# Patient Record
Sex: Male | Born: 1950 | Race: Black or African American | Hispanic: No | Marital: Single | Smoking: Never smoker
Health system: Southern US, Community
[De-identification: ages and names within clinical notes are randomized; demographics above are authoritative.]

## PROBLEM LIST (undated history)

## (undated) DIAGNOSIS — N200 Calculus of kidney: Secondary | ICD-10-CM

## (undated) DIAGNOSIS — Z860100 Personal history of colon polyps, unspecified: Secondary | ICD-10-CM

## (undated) DIAGNOSIS — R972 Elevated prostate specific antigen [PSA]: Secondary | ICD-10-CM

## (undated) DIAGNOSIS — N529 Male erectile dysfunction, unspecified: Secondary | ICD-10-CM

## (undated) DIAGNOSIS — I10 Essential (primary) hypertension: Secondary | ICD-10-CM

## (undated) DIAGNOSIS — M1990 Unspecified osteoarthritis, unspecified site: Secondary | ICD-10-CM

## (undated) DIAGNOSIS — M519 Unspecified thoracic, thoracolumbar and lumbosacral intervertebral disc disorder: Secondary | ICD-10-CM

## (undated) DIAGNOSIS — C61 Malignant neoplasm of prostate: Secondary | ICD-10-CM

## (undated) DIAGNOSIS — R339 Retention of urine, unspecified: Secondary | ICD-10-CM

## (undated) DIAGNOSIS — E785 Hyperlipidemia, unspecified: Secondary | ICD-10-CM

## (undated) DIAGNOSIS — H269 Unspecified cataract: Secondary | ICD-10-CM

## (undated) DIAGNOSIS — R319 Hematuria, unspecified: Secondary | ICD-10-CM

## (undated) DIAGNOSIS — N400 Enlarged prostate without lower urinary tract symptoms: Secondary | ICD-10-CM

## (undated) HISTORY — PX: APPENDECTOMY: SHX54

## (undated) HISTORY — DX: Benign prostatic hyperplasia without lower urinary tract symptoms: N40.0

## (undated) HISTORY — DX: Personal history of colon polyps, unspecified: Z86.0100

## (undated) HISTORY — DX: Calculus of kidney: N20.0

## (undated) HISTORY — DX: Male erectile dysfunction, unspecified: N52.9

## (undated) HISTORY — PX: COLONOSCOPY: SHX174

## (undated) HISTORY — DX: Essential (primary) hypertension: I10

## (undated) HISTORY — PX: POLYPECTOMY: SHX149

## (undated) HISTORY — DX: Hyperlipidemia, unspecified: E78.5

## (undated) HISTORY — PX: PROSTATE SURGERY: SHX751

## (undated) HISTORY — PX: TONSILLECTOMY: SUR1361

## (undated) HISTORY — DX: Personal history of colonic polyps: Z86.010

## (undated) HISTORY — DX: Unspecified cataract: H26.9

---

## 1999-04-14 ENCOUNTER — Other Ambulatory Visit: Admission: RE | Admit: 1999-04-14 | Discharge: 1999-04-14 | Payer: Self-pay | Admitting: Urology

## 2003-03-22 ENCOUNTER — Ambulatory Visit (HOSPITAL_COMMUNITY): Admission: RE | Admit: 2003-03-22 | Discharge: 2003-03-22 | Payer: Self-pay | Admitting: Internal Medicine

## 2004-04-06 ENCOUNTER — Ambulatory Visit (HOSPITAL_COMMUNITY): Admission: RE | Admit: 2004-04-06 | Discharge: 2004-04-06 | Payer: Self-pay | Admitting: Urology

## 2004-08-06 HISTORY — PX: CYSTOSCOPY: SUR368

## 2005-04-12 ENCOUNTER — Ambulatory Visit (HOSPITAL_BASED_OUTPATIENT_CLINIC_OR_DEPARTMENT_OTHER): Admission: RE | Admit: 2005-04-12 | Discharge: 2005-04-12 | Payer: Self-pay | Admitting: Urology

## 2005-04-12 ENCOUNTER — Ambulatory Visit (HOSPITAL_COMMUNITY): Admission: RE | Admit: 2005-04-12 | Discharge: 2005-04-12 | Payer: Self-pay | Admitting: Urology

## 2005-08-06 HISTORY — PX: TRANSURETHRAL RESECTION OF PROSTATE: SHX73

## 2006-08-06 HISTORY — PX: LITHOTRIPSY: SUR834

## 2007-01-05 HISTORY — PX: PROSTATE BIOPSY: SHX241

## 2007-01-22 ENCOUNTER — Emergency Department (HOSPITAL_COMMUNITY): Admission: EM | Admit: 2007-01-22 | Discharge: 2007-01-23 | Payer: Self-pay | Admitting: Emergency Medicine

## 2007-01-29 ENCOUNTER — Encounter: Payer: Self-pay | Admitting: Urology

## 2007-01-29 ENCOUNTER — Inpatient Hospital Stay (HOSPITAL_COMMUNITY): Admission: RE | Admit: 2007-01-29 | Discharge: 2007-01-31 | Payer: Self-pay | Admitting: Urology

## 2008-03-01 ENCOUNTER — Ambulatory Visit: Payer: Self-pay | Admitting: Internal Medicine

## 2008-03-17 ENCOUNTER — Ambulatory Visit: Payer: Self-pay | Admitting: Internal Medicine

## 2008-03-17 ENCOUNTER — Encounter: Payer: Self-pay | Admitting: Internal Medicine

## 2008-03-24 DIAGNOSIS — Z860100 Personal history of colon polyps, unspecified: Secondary | ICD-10-CM | POA: Insufficient documentation

## 2008-03-26 ENCOUNTER — Encounter: Payer: Self-pay | Admitting: Internal Medicine

## 2008-08-06 HISTORY — PX: CYSTOSCOPY: SUR368

## 2009-01-13 ENCOUNTER — Ambulatory Visit (HOSPITAL_BASED_OUTPATIENT_CLINIC_OR_DEPARTMENT_OTHER): Admission: RE | Admit: 2009-01-13 | Discharge: 2009-01-13 | Payer: Self-pay | Admitting: Urology

## 2009-08-06 HISTORY — PX: INGUINAL HERNIA REPAIR: SHX194

## 2010-04-27 ENCOUNTER — Telehealth: Payer: Self-pay | Admitting: Internal Medicine

## 2010-05-10 ENCOUNTER — Encounter: Payer: Self-pay | Admitting: Internal Medicine

## 2010-06-09 ENCOUNTER — Ambulatory Visit (HOSPITAL_COMMUNITY): Admission: RE | Admit: 2010-06-09 | Discharge: 2010-06-09 | Payer: Self-pay | Admitting: General Surgery

## 2010-09-05 NOTE — Progress Notes (Signed)
Summary: Triage/Appt. w/Dr.Wyatt  Phone Note Call from Patient Call back at Home Phone [phone]   Caller: Patient Call For: Dr Leone Payor Reason for Call: Talk to Nurse Summary of Call: Patient wants to know if he can get reffered to the doctor Dr Leone Payor wanted him to see for his hernia. Initial call taken by: Tawni Levy,  April 27, 2010 1:29 PM  Follow-up for Phone Call        Pt. states that at his Colonoscopy in 2009 Dr.Irianna Gilday told him he would refer him to a surgeon for an inginual hernia repair. Pt. would like to be referred now.  DR.Laiken Nohr-Please advise  Follow-up by: Laureen Ochs LPN,  April 27, 2010 3:48 PM  Additional Follow-up for Phone Call Additional follow up Details #1::        [facility] - anyone that does laparoscopic hernia repair Additional Follow-up by: Iva Boop MD, Clementeen Graham,  May 01, 2010 6:56 AM    Additional Follow-up for Phone Call Additional follow up Details #2::    Pt. is scheduled to see Dr.Wyatt on 05-09-10 at 10:25am. I have left pt. a msg. with appt. information and mailed him an appt. reminder with [facility] information on it. Pt. instructed to call back as needed.  Follow-up by: Laureen Ochs LPN,  May 01, 2010 10:36 AM

## 2010-09-07 NOTE — Letter (Signed)
Summary: [facility]   Imported By: Lester [place] 07/25/2010 12:04:48  _____________________________________________________________________  External Attachment:    Type:   Image     Comment:   External Document

## 2010-10-17 LAB — BASIC METABOLIC PANEL
CO2: 26 mEq/L (ref 19–32)
Calcium: 9.7 mg/dL (ref 8.4–10.5)
Chloride: 105 mEq/L (ref 96–112)
GFR calc Af Amer: >60 mL/min (ref >60)
Glucose, Bld: 89 mg/dL (ref 70–99)
Potassium: 3.8 mEq/L (ref 3.5–5.1)
Sodium: 137 mEq/L (ref 135–145)

## 2010-10-17 LAB — DIFFERENTIAL
Basophils Absolute: 0 10*3/uL (ref 0.0–0.1)
Eosinophils Absolute: 0.1 10*3/uL (ref 0.0–0.7)
Lymphs Abs: 3.1 10*3/uL (ref 0.7–4.0)
Monocytes Absolute: 0.9 10*3/uL (ref 0.1–1.0)
Monocytes Relative: 9 % (ref 3–12)
Neutrophils Relative %: 59 % (ref 43–77)

## 2010-10-17 LAB — CBC
HCT: 46.1 % (ref 39.0–52.0)
Hemoglobin: 15.3 g/dL (ref 13.0–17.0)
MCH: 29.4 pg (ref 26.0–34.0)
MCHC: 33.2 g/dL (ref 30.0–36.0)
RBC: 5.21 MIL/uL (ref 4.22–5.81)

## 2010-11-13 LAB — POCT I-STAT 4, (NA,K, GLUC, HGB,HCT)
HCT: 47 % (ref 39.0–52.0)
Sodium: 141 mEq/L (ref 135–145)

## 2010-12-19 NOTE — Op Note (Signed)
[name], Thomas Stuart               ACCOUNT NO.:  [id number]   MEDICAL RECORD NO.:  [id number]          PATIENT TYPE:  AMB   LOCATION:  [facility]                         FACILITY:  [facility]   PHYSICIAN:  Excell Seltzer. Annabell Howells, M.D.    DATE OF BIRTH:  [birth year]   DATE OF PROCEDURE:  01/13/2009  DATE OF DISCHARGE:                               OPERATIVE REPORT   PROCEDURE:  Cystoscopy with fulguration.   PREOPERATIVE DIAGNOSIS:  Intermittent gross hematuria.   POSTOPERATIVE DIAGNOSES:  Intermittent gross hematuria with benign  prostatic hypertrophy regrowth and inflammatory urethral polyps.   SURGEON:  Dr. Bjorn Pippin.   ANESTHESIA:  General.   SPECIMEN:  None.   DRAINS:  None.   COMPLICATIONS:  None.   INDICATIONS:  [name] is a 60 year old black male with a history of BPH  with bladder outlet obstruction.  He underwent a TURP in 2008.  He has  recently had some intermittent gross hematuria.  Upper tract studies  were unremarkable with exception of a mass at the base the bladder that  was felt to probably be prostatic tissue.   FINDINGS AND PROCEDURE:  The patient was taken to the operating room  where general anesthetic was induced.  He was placed in the lithotomy  position.  His perineum and genitalia were prepped with Betadine  solution.  He was draped in the usual sterile fashion.  Cystoscopy was  performed using a 16-French flexible scope.  Examination revealed a  normal urethra.  The external sphincter was intact.  The prostatic  urethra exhibited a patent TUR defect, however, at the bladder neck on  the left there was intravesical extension of prostatic regrowth.  There  was some neovascularity in the prostatic urethra and small inflammatory  appearing polyps.  Inspection of the bladder revealed normal mucosa  without lesions.  No stones were noted.  The ureteral orifices were  unremarkable.   After thorough inspection, a Bugbee electrode was used to fulgurate the  neovascularity in  the prostatic urethra and the bladder neck and the  prostate urethral polyps as they were felt to be the most likely source  of his bleeding.  If  this not take care of the proper, he may need re-resection of prostatic  regrowth.  At this point, the bladder was drained with a 16-French red  rubber catheter which was then removed.  His anesthetic was reversed.  He was taken to the recovery room in stable condition.  There were no  complications.      Excell Seltzer. Annabell Howells, M.D.  Electronically Signed     JJW/MEDQ  D:  01/13/2009  T:  01/13/2009  Job:  [id number]

## 2010-12-19 NOTE — Op Note (Signed)
Thomas Stuart, Thomas Stuart               ACCOUNT NO.:  [id number]   MEDICAL RECORD NO.:  [id number]          PATIENT TYPE:  INP   LOCATION:  1410                         FACILITY:  [facility]   PHYSICIAN:  Excell Seltzer. Annabell Howells, M.D.    DATE OF BIRTH:  January 16, 1951   DATE OF PROCEDURE:  01/30/2007  DATE OF DISCHARGE:                               OPERATIVE REPORT   PROCEDURE:  Transurethral resection of prostate.   PREOPERATIVE DIAGNOSIS:  Benign prostatic hypertrophy with bladder  outlet obstruction and hematuria.   POSTOPERATIVE DIAGNOSIS:  Benign prostatic hypertrophy with bladder  outlet obstruction and hematuria.   SURGEON:  Dr. Bjorn Pippin.   ANESTHESIA:  General.   SPECIMEN:  Prostate chips.   DRAIN:  24-French three-way Foley catheter.   COMPLICATIONS:  None.   INDICATIONS:  Thomas Stuart is a 60 year old black male with enlarged  prostate.  He had been on Avodart because of recurrent hematuria, but  despite this treatment, he developed gross hematuria with clot retention  was felt that TURP was indicated.   FINDINGS AND PROCEDURE:  The patient is given Cipro, was taken operating  room where general anesthetic was induced and he was placed in lithotomy  position.  His Foley catheter was removed.  He was prepped with Betadine  solution.  He was draped in the usual sterile fashion.  A 28-French  continuous flow resectoscope sheath was inserted.  This was fitted with  12 degrees lens with Wandra Scot handle and TUR loop.  Examination revealed  a long prostate with significant bilobar hyperplasia but also an annular  middle lobe protruding into the bladder in a donut-like fashion.  I was  unable to identify the ureteral orifices due to the extent of the middle  lobe enlargement.  But I did see an area of what appeared to be recent  bleeding on the right middle lobe area.  The posterior wall of the  bladder had significant catheter irritation but no tumors or stones were  seen.   Resection of  the prostate was performed initially the middle lobe was  resected.  The patient's prostate was quite vascular and the resection  was quite tedious due to the bulk of the middle lobe and the vascularity  but I was eventually able to debulk the middle lobe sufficiently with  great care taken to try to avoid digging into the bladder wall as I  resected the middle lobe back to the bladder neck.  I then resected the  bladder neck area from five to seven o'clock down to the capsular fibers  and then resected the floor of the prostate out to alongside the  verumontanum.  The chips were evacuated from this portion of the  resection and I then resected the left lobe from bladder neck to apex  out to the capsular fibers.  This was followed by the right lobe.  Significant residual apical and middle lobe tissue was then resected  after the initial resection had been completed. At this point the  bladder was evacuated free of chips and hemostasis was achieved.  I  completed one  hour of resection and had debulked the prostate  significantly; however, I did not perform a complete resection due to  the bulk of the prostate and there is a possibility that he may require  a second staged TURP.  After completion of procedure, inspection  revealed no evidence of injury to the external sphincter.  No retained  chips or active bleeding, the ureteral orifices were never visualized  during the procedure.  After removal of the scope pressure on the  bladder produced an excellent stream.  A 24-French three-way Foley  catheter was then inserted with aid of a catheter guide.  The balloon  was filled initially with 30 mL of fluid but finally I ended up using 50  mL of fluid to provide adequate compression of the bladder neck during  irrigation.  With tension on Foley and irrigation, the efflux eventually  cleared.  The patient was placed on continuous irrigation.  The Foley  catheter was secured to the patient's  thigh with Velcro strap on mild  traction.  The patient is taken down from lithotomy position.  His  anesthetic was reversed.  He was removed to the recovery room in stable  condition.  There were no complications.      Excell Seltzer. Annabell Howells, M.D.  Electronically Signed     JJW/MEDQ  D:  01/30/2007  T:  01/31/2007  Job:  [id number]

## 2010-12-22 NOTE — Op Note (Signed)
NAMEERVINE, WITUCKI               ACCOUNT NO.:  [id number]   MEDICAL RECORD NO.:  [id number]          PATIENT TYPE:  AMB   LOCATION:  [facility]                         FACILITY:  [facility]   PHYSICIAN:  Excell Seltzer. Annabell Howells, M.D.    DATE OF BIRTH:  1950/10/07   DATE OF PROCEDURE:  04/12/2005  DATE OF DISCHARGE:                                 OPERATIVE REPORT   PROCEDURE:  Cystoscopy, removal of stone from the prostatic urethra.   PREOPERATIVE DIAGNOSES:  1.  Benign prostatic hypertrophy with bladder outlet obstruction.  2.  Prostatic urethral stone.  3.  Hematuria.   POSTOPERATIVE DIAGNOSES:  1.  Benign prostatic hypertrophy with bladder outlet obstruction.  2.  Prostatic urethral stone.  3.  Hematuria.   SURGEON:  Excell Seltzer. Annabell Howells, M.D.   ANESTHESIA:  General.   SPECIMEN:  Stone.   COMPLICATIONS:  None.   INDICATIONS:  Mr. Rodarte is a 60 year old black male with a history of  urolithiasis who has developed intermittent gross hematuria. Cystoscopy in  the office revealed two stones impacted in the prostatic urethra. He has  since passed one but continues to have bleeding. It was felt that cysto and  stone removal was indicated. A CT scan revealed no stones in the bladder.  There was a small stone in one kidney and there was some haziness at the  base the bladder and I felt he needed to be evaluated for possible bladder  tumor as well.   FINDINGS AND PROCEDURE:  The patient was taken to the operating room after  receiving p.o. Cipro. A general anesthetic was induced. He was placed in  lithotomy position. His perineum and genitalia were prepped with Betadine  solution and he was draped in the usual sterile fashion. Cystoscopy was  performed using a 16-French flexible scope. Examination revealed a normal  urethra. The external sphincter was intact. The prostatic urethra was  approximately 3-4 cm in length with bilobar hyperplasia with coaptation and  obstruction. Anterior near the bladder  neck was a stone impacted in the  prostatic urethra. Examination of the bladder revealed mild trabeculation.  No tumors or stones were noted. There was intravesical extension of the  prostate but without a true middle lobe. Ureteral orifices were not well  seen.   The stone was then grasped with a nitinol stone basket and removed without  difficulty.   Reinspection of the bladder revealed no additional abnormalities or active  bleeding.   The patient was taken down from lithotomy position. His anesthetic was  reversed. He was moved to the recovery room in stable condition and there  were no complications.      Excell Seltzer. Annabell Howells, M.D.  Electronically Signed     JJW/MEDQ  D:  04/12/2005  T:  04/12/2005  Job:  [id number]   cc:   Gloriajean Dell. Andrey Campanile, M.D.  [address]  [place]  [place] [address]  Fax: [phone]

## 2011-04-25 ENCOUNTER — Encounter: Payer: Self-pay | Admitting: Internal Medicine

## 2011-04-30 ENCOUNTER — Encounter: Payer: Self-pay | Admitting: Internal Medicine

## 2011-05-23 LAB — BASIC METABOLIC PANEL
CO2: 29
Calcium: 9.7
GFR calc Af Amer: >60
GFR calc non Af Amer: >60
Sodium: 145

## 2011-05-23 LAB — URINALYSIS, ROUTINE W REFLEX MICROSCOPIC
Specific Gravity, Urine: 1.029
Urobilinogen, UA: 1

## 2011-05-23 LAB — BASIC METABOLIC PANEL WITH GFR
BUN: 8
CO2: 28
Calcium: 8.6
Chloride: 103
Creatinine, Ser: 0.78
GFR calc non Af Amer: >60
Glucose, Bld: 161 — ABNORMAL HIGH
Potassium: 4.1
Sodium: 137

## 2011-05-23 LAB — HEMOGLOBIN AND HEMATOCRIT, BLOOD
HCT: 37.3 — ABNORMAL LOW
Hemoglobin: 12.7 — ABNORMAL LOW

## 2011-05-23 LAB — URINE MICROSCOPIC-ADD ON

## 2011-05-23 LAB — CBC
Platelets: 339
RBC: 3.44 — ABNORMAL LOW
WBC: 15.7 — ABNORMAL HIGH

## 2011-06-01 ENCOUNTER — Ambulatory Visit (AMBULATORY_SURGERY_CENTER): Payer: Managed Care, Other (non HMO) | Admitting: *Deleted

## 2011-06-01 ENCOUNTER — Encounter: Payer: Self-pay | Admitting: Internal Medicine

## 2011-06-01 VITALS — Ht 71.0 in | Wt 215.0 lb

## 2011-06-01 DIAGNOSIS — Z1211 Encounter for screening for malignant neoplasm of colon: Secondary | ICD-10-CM

## 2011-06-01 MED ORDER — PEG-KCL-NACL-NASULF-NA ASC-C 100 G PO SOLR: ORAL | Status: DC

## 2011-06-15 ENCOUNTER — Encounter: Payer: Self-pay | Admitting: Internal Medicine

## 2011-06-15 ENCOUNTER — Ambulatory Visit (AMBULATORY_SURGERY_CENTER): Payer: Managed Care, Other (non HMO) | Admitting: Internal Medicine

## 2011-06-15 VITALS — BP 128/85 | HR 68 | Temp 97.0°F | Resp 16 | Ht 71.0 in | Wt 215.0 lb

## 2011-06-15 DIAGNOSIS — Z860100 Personal history of colon polyps, unspecified: Secondary | ICD-10-CM

## 2011-06-15 DIAGNOSIS — Z1211 Encounter for screening for malignant neoplasm of colon: Secondary | ICD-10-CM

## 2011-06-15 MED ORDER — SODIUM CHLORIDE 0.9 % IV SOLN: 500.0000 mL | INTRAVENOUS | Status: DC

## 2011-06-15 NOTE — Patient Instructions (Addendum)
No polyps today! Next routine preventive colonoscopy in about 5 years (2017).  PLEASE FOLLOW DISCHARGE INSTRUCTIONS GIVEN TODAY. SEE HANDOUTS. RESUME CURRENT MEDICATIONS. CALL us WITH ANY QUESTIONS OR CONCERNS [phone]. THANK YOU!

## 2011-06-18 ENCOUNTER — Telehealth: Payer: Self-pay | Admitting: *Deleted

## 2011-06-18 NOTE — Telephone Encounter (Signed)

## 2013-06-16 ENCOUNTER — Other Ambulatory Visit: Payer: Self-pay | Admitting: Urology

## 2013-06-16 DIAGNOSIS — R972 Elevated prostate specific antigen [PSA]: Secondary | ICD-10-CM

## 2013-06-24 ENCOUNTER — Ambulatory Visit (HOSPITAL_COMMUNITY)
Admission: RE | Admit: 2013-06-24 | Discharge: 2013-06-24 | Disposition: A | Discharge status: Home / Self Care | Payer: BC Managed Care – PPO | Source: Ambulatory Visit | Attending: Urology | Admitting: Urology

## 2013-06-24 DIAGNOSIS — R972 Elevated prostate specific antigen [PSA]: Secondary | ICD-10-CM

## 2013-06-24 DIAGNOSIS — N4289 Other specified disorders of prostate: Secondary | ICD-10-CM | POA: Insufficient documentation

## 2013-06-24 MED ORDER — GADOBENATE DIMEGLUMINE 529 MG/ML IV SOLN
20.0000 mL | Freq: Once | INTRAVENOUS | Status: AC | PRN
  Administered 2013-06-24: 20 mL via INTRAVENOUS

## 2013-06-25 LAB — POCT I-STAT, CHEM 8
Calcium, Ion: 1.18 mmol/L (ref 1.13–1.30)
Creatinine, Ser: 1 mg/dL (ref 0.50–1.35)
Glucose, Bld: 102 mg/dL — ABNORMAL HIGH (ref 70–99)
HCT: 46 % (ref 39.0–52.0)
Hemoglobin: 15.6 g/dL (ref 13.0–17.0)
Potassium: 3.2 mEq/L — ABNORMAL LOW (ref 3.5–5.1)

## 2014-04-07 ENCOUNTER — Other Ambulatory Visit: Payer: Self-pay | Admitting: Urology

## 2014-04-08 ENCOUNTER — Encounter (HOSPITAL_BASED_OUTPATIENT_CLINIC_OR_DEPARTMENT_OTHER): Payer: Self-pay | Admitting: *Deleted

## 2014-04-08 NOTE — Progress Notes (Signed)
Pt instructed npo p mn 9/9.  To [facility] 9/10 @ 0600.  Needs ekg, istat-8 on arrival. Pt aware to do fleets enema 2 hrs prior to arrival

## 2014-04-14 NOTE — H&P (Signed)
Active Problems Problems  1. Benign prostatic hyperplasia with urinary obstruction (600.01,599.69) 2. Elevated prostate specific antigen (PSA) (790.93) 3. 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 History of Gross hematuria (599.71)  History of Present Illness Thomas Stuart returns today in f/u.  He has had some recent hematuria with drips at the end of his stream.  He had it for about 2 days but it has been clear for 5 days.  He was switched to finasteride from Avodart for his BPH regrowth with hematuria. His PSA is up further to 15.56 from 13.43 in June and 9.91 in 7/14.  His PSA had been up to over 11 and has had at least 2 negative biopsies with the last in 09/2003 when his PSA was 11.5 and prior to starting Avodart in 2006. It has been as low as 6 on therapy. He is voiding well without complaints and has voided well since. He had a TURP in 2008. He had an MRI in 10/14 that showed no areas of concern. He does have a large intravesical prostate lobe.     Past Medical History Problems  1. History of Disc degeneration, lumbar (722.52) 2. History of Gross hematuria (599.71) 3. History of hypertension (V12.59) 4. History of kidney stones (V13.01) 5. History of Urinary retention (788.20)  Surgical History Problems  1. History of Biopsy Of The Prostate Needle 2. History of Cystoscopy With Fulguration 3. History of Hernia Repair 4. History of Hernia Repair 5. History of Lithotripsy 6. History of Transurethral Resection Of Prostate (TURP)  Current Meds 1. Benazepril-Hydrochlorothiazide 20-12.5 MG Oral Tablet;  Therapy: (Recorded:10Nov2014) to Recorded 2. Cialis 20 MG Oral Tablet; TAKE 1 TABLET As Directed PRN;  Therapy: 24Oct2008 to (Last Rx:22Apr2013)  Requested for: 22Apr2013 Ordered 3. Finasteride 5 MG Oral Tablet; TAKE 1 TABLET DAILY AS DIRECTED;  Therapy: 30Apr2014 to (Evaluate:06May2016)  Requested for: [id number]; Last  Rx:12May2015 Ordered 4. Meloxicam 7.5 MG Oral  Tablet;  Therapy: 30Apr2014 to Recorded 5. Multi-Vitamin TABS;  Therapy: (Recorded:25Jun2010) to Recorded 6. Vitamin B-12 TABS;  Therapy: (Recorded:25Jun2010) to Recorded 7. Vitamin C TABS;  Therapy: (Recorded:25Jun2010) to Recorded 8. Vitamin D TABS;  Therapy: (Recorded:22Apr2013) to Recorded 9. Vitamin E TABS;  Therapy: (Recorded:30Apr2014) to Recorded  Allergies Medication  1. No Known Drug Allergies  Family History Problems  1. Family history of malignant neoplasm of male breast (V16.3) : Mother 2. Family history of senile dementia (V17.2) : Mother 3. Denied: Family history of Prostate Cancer  Social History Problems  1. Denied: Alcohol Use 2. Denied: History of Caffeine Use 3. Marital History - Single 4. 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 Denied: Tobacco Use  Review of Systems  Genitourinary: hematuria.  Constitutional: no fever.  Cardiovascular: no chest pain.  Respiratory: no shortness of breath.    Vitals Vital Signs [Data Includes: Last 1 Day]  Recorded: 26Aug2015 10:24AM  Blood Pressure: 162 / 90 Temperature: 98 F Heart Rate: 73  Results/Data Urine [Data Includes: Last 1 Day]   26Aug2015  COLOR STRAW   APPEARANCE CLEAR   SPECIFIC GRAVITY <1.005   pH 6.5   GLUCOSE NEG mg/dL  BILIRUBIN NEG   KETONE NEG mg/dL  BLOOD NEG   PROTEIN NEG mg/dL  UROBILINOGEN 0.2 mg/dL  NITRITE NEG   LEUKOCYTE ESTERASE NEG    The following clinical lab reports were reviewed:  UA and PSA reviewed. PSA Flowsheet Thomas Stuart Includes: Last 3 Years]   21Aug2015 [id number] Thomas Stuart Thomas Stuart  PSA  15.56 ng/mL 13.43 ng/mL 9.91 ng/mL 7.78 ng/mL  Free PSA   0.83 ng/mL 0.75 ng/mL  % Free PSA   8 % 10 %  Assessment Assessed  1. Elevated prostate specific antigen (PSA) (790.93) 2. Benign prostatic hyperplasia with urinary obstruction (600.01,599.69) 3. History of Gross hematuria (599.71)  His PSA continues to rise on the finasteride and he  has had additional hematuria.   Plan Elevated prostate specific antigen (PSA)  1. Start: Diazepam 10 MG Oral Tablet; Take 1 -2 po one hour prior to procedure 2. Start: Levofloxacin 500 MG Oral Tablet (Levaquin); Take one tablet daily starting day  before procedure 3. PROSTATE BIOPSY ULTRASOUND; Status:Hold For - Appointment,Date of Service;  Requested for:26Aug2015;  Health Maintenance  4. UA With REFLEX; [Do Not Release]; Status:Resulted - Requires Verification;   Done:  Thomas Stuart Thomas Stuart PMH: Gross hematuria  5. AU CT-HEMATURIA PROTOCOL; Status:Hold For - Appointment,Date of  Service,PreCert,Print; Requested for:26Aug2015;  6. Cysto; Status:Hold For - Appointment,Date of Service; Requested for:26Aug2015;  7. Follow-up Office  Follow-up  Status: Hold For - Appointment,Date of Service  Requested  for: 26Aug2015  I am going to get a CT hematuria study and then get him set up for a prostate [place] and biopsy and cystoscopy. The risks of bleeding, infection and voiding difficulty were reviewed. I will give him valium and levaquin for the procedure.   Discussion/Summary CC: Dr. Kathryne Eriksson.

## 2014-04-15 ENCOUNTER — Ambulatory Visit (HOSPITAL_BASED_OUTPATIENT_CLINIC_OR_DEPARTMENT_OTHER): Payer: BC Managed Care – PPO | Admitting: Anesthesiology

## 2014-04-15 ENCOUNTER — Encounter (HOSPITAL_BASED_OUTPATIENT_CLINIC_OR_DEPARTMENT_OTHER): Payer: Self-pay

## 2014-04-15 ENCOUNTER — Encounter (HOSPITAL_BASED_OUTPATIENT_CLINIC_OR_DEPARTMENT_OTHER): Payer: BC Managed Care – PPO | Admitting: Anesthesiology

## 2014-04-15 ENCOUNTER — Encounter (HOSPITAL_BASED_OUTPATIENT_CLINIC_OR_DEPARTMENT_OTHER)
Admission: RE | Disposition: A | Discharge status: Home / Self Care | Payer: Self-pay | Source: Ambulatory Visit | Attending: Urology

## 2014-04-15 ENCOUNTER — Ambulatory Visit (HOSPITAL_BASED_OUTPATIENT_CLINIC_OR_DEPARTMENT_OTHER)
Admission: RE | Admit: 2014-04-15 | Discharge: 2014-04-15 | Disposition: A | Discharge status: Home / Self Care | Payer: BC Managed Care – PPO | Source: Ambulatory Visit | Attending: Urology | Admitting: Urology

## 2014-04-15 DIAGNOSIS — C61 Malignant neoplasm of prostate: Secondary | ICD-10-CM

## 2014-04-15 DIAGNOSIS — N401 Enlarged prostate with lower urinary tract symptoms: Secondary | ICD-10-CM | POA: Diagnosis present

## 2014-04-15 DIAGNOSIS — Z87442 Personal history of urinary calculi: Secondary | ICD-10-CM | POA: Insufficient documentation

## 2014-04-15 DIAGNOSIS — R972 Elevated prostate specific antigen [PSA]: Secondary | ICD-10-CM | POA: Diagnosis present

## 2014-04-15 DIAGNOSIS — I10 Essential (primary) hypertension: Secondary | ICD-10-CM | POA: Insufficient documentation

## 2014-04-15 DIAGNOSIS — R310 Gross hematuria: Secondary | ICD-10-CM | POA: Diagnosis present

## 2014-04-15 DIAGNOSIS — N138 Other obstructive and reflux uropathy: Secondary | ICD-10-CM | POA: Diagnosis present

## 2014-04-15 HISTORY — DX: Unspecified osteoarthritis, unspecified site: M19.90

## 2014-04-15 HISTORY — DX: Elevated prostate specific antigen (PSA): R97.20

## 2014-04-15 HISTORY — PX: PROSTATE BIOPSY: SHX241

## 2014-04-15 HISTORY — DX: Malignant neoplasm of prostate: C61

## 2014-04-15 HISTORY — DX: Hematuria, unspecified: R31.9

## 2014-04-15 HISTORY — PX: CYSTOSCOPY: SHX5120

## 2014-04-15 LAB — POCT I-STAT, CHEM 8
BUN: 7 mg/dL (ref 6–23)
Calcium, Ion: 1.11 mmol/L — ABNORMAL LOW (ref 1.13–1.30)
Chloride: 103 mEq/L (ref 96–112)
Creatinine, Ser: 0.9 mg/dL (ref 0.50–1.35)
Glucose, Bld: 121 mg/dL — ABNORMAL HIGH (ref 70–99)
HCT: 47 % (ref 39.0–52.0)
Hemoglobin: 16 g/dL (ref 13.0–17.0)
Potassium: 3.6 mEq/L — ABNORMAL LOW (ref 3.7–5.3)
Sodium: 139 mEq/L (ref 137–147)
TCO2: 25 mmol/L (ref 0–100)

## 2014-04-15 SURGERY — CYSTOSCOPY
Anesthesia: General | Site: Prostate

## 2014-04-15 MED ORDER — LIDOCAINE HCL 2 % EX GEL
CUTANEOUS | Status: DC | PRN
  Administered 2014-04-15: 1 via URETHRAL

## 2014-04-15 MED ORDER — LACTATED RINGERS IV SOLN
INTRAVENOUS | Status: DC
  Administered 2014-04-15 (×2): via INTRAVENOUS
  Filled 2014-04-15: qty 1000

## 2014-04-15 MED ORDER — ONDANSETRON HCL 4 MG/2ML IJ SOLN
INTRAMUSCULAR | Status: DC | PRN
  Administered 2014-04-15: 4 mg via INTRAVENOUS

## 2014-04-15 MED ORDER — DEXAMETHASONE SODIUM PHOSPHATE 4 MG/ML IJ SOLN
INTRAMUSCULAR | Status: DC | PRN
  Administered 2014-04-15: 8 mg via INTRAVENOUS

## 2014-04-15 MED ORDER — CEFTRIAXONE SODIUM 2 G IJ SOLR
2.0000 g | INTRAMUSCULAR | Status: AC
  Administered 2014-04-15: 2 g via INTRAVENOUS
  Filled 2014-04-15: qty 2

## 2014-04-15 MED ORDER — CEFTRIAXONE SODIUM 2 G IJ SOLR
INTRAMUSCULAR | Status: AC
  Filled 2014-04-15: qty 2

## 2014-04-15 MED ORDER — PROPOFOL INFUSION 10 MG/ML OPTIME
INTRAVENOUS | Status: DC | PRN
  Administered 2014-04-15: 150 ug/kg/min via INTRAVENOUS

## 2014-04-15 MED ORDER — MIDAZOLAM HCL 2 MG/2ML IJ SOLN
INTRAMUSCULAR | Status: AC
  Filled 2014-04-15: qty 2

## 2014-04-15 MED ORDER — LIDOCAINE HCL (CARDIAC) 20 MG/ML IV SOLN
INTRAVENOUS | Status: DC | PRN
  Administered 2014-04-15: 60 mg via INTRAVENOUS

## 2014-04-15 MED ORDER — FENTANYL CITRATE 0.05 MG/ML IJ SOLN
INTRAMUSCULAR | Status: AC
  Filled 2014-04-15: qty 6

## 2014-04-15 MED ORDER — SODIUM CHLORIDE 0.9 % IR SOLN
Status: DC | PRN
  Administered 2014-04-15: 3000 mL

## 2014-04-15 MED ORDER — GENTAMICIN SULFATE 40 MG/ML IJ SOLN
5.0000 mg/kg | Freq: Once | INTRAVENOUS | Status: AC
  Administered 2014-04-15: 430 mg via INTRAVENOUS
  Filled 2014-04-15: qty 10.75

## 2014-04-15 MED ORDER — FLEET ENEMA 7-19 GM/118ML RE ENEM
1.0000 | ENEMA | Freq: Once | RECTAL | Status: DC
  Filled 2014-04-15: qty 1

## 2014-04-15 MED ORDER — HYDROCODONE-ACETAMINOPHEN 5-325 MG PO TABS: 1.0000 | ORAL_TABLET | Freq: Four times a day (QID) | ORAL | Status: DC | PRN

## 2014-04-15 MED ORDER — MIDAZOLAM HCL 5 MG/5ML IJ SOLN
INTRAMUSCULAR | Status: DC | PRN
  Administered 2014-04-15: 2 mg via INTRAVENOUS

## 2014-04-15 MED ORDER — GENTAMICIN SULFATE 40 MG/ML IJ SOLN: 5.0000 mg/kg | INTRAVENOUS | Status: DC

## 2014-04-15 MED ORDER — PROPOFOL 10 MG/ML IV BOLUS
INTRAVENOUS | Status: DC | PRN
  Administered 2014-04-15: 180 mg via INTRAVENOUS

## 2014-04-15 MED ORDER — FENTANYL CITRATE 0.05 MG/ML IJ SOLN
INTRAMUSCULAR | Status: DC | PRN
  Administered 2014-04-15: 25 ug via INTRAVENOUS
  Administered 2014-04-15: 50 ug via INTRAVENOUS
  Administered 2014-04-15: 25 ug via INTRAVENOUS

## 2014-04-15 SURGICAL SUPPLY — 34 items
BAG DRAIN URO-CYSTO SKYTR STRL (DRAIN) ×2 IMPLANT
CANISTER SUCT LVC 12 LTR MEDI- (MISCELLANEOUS) ×2 IMPLANT
CATH FOLEY 2WAY SLVR  5CC 16FR (CATHETERS)
CATH FOLEY 2WAY SLVR 5CC 16FR (CATHETERS) IMPLANT
CATH URET 5FR 28IN OPEN ENDED (CATHETERS) IMPLANT
CLOTH BEACON ORANGE TIMEOUT ST (SAFETY) ×2 IMPLANT
DRAPE CAMERA CLOSED 9X96 (DRAPES) ×4 IMPLANT
DRESSING TELFA 8X3 (GAUZE/BANDAGES/DRESSINGS) ×2 IMPLANT
ELECT LOOP MED HF 24F 12D CBL (CLIP) ×2 IMPLANT
ELECT REM PT RETURN 9FT ADLT (ELECTROSURGICAL)
ELECTRODE REM PT RTRN 9FT ADLT (ELECTROSURGICAL) IMPLANT
GLOVE BIOGEL M 6.5 STRL (GLOVE) ×2 IMPLANT
GLOVE BIOGEL PI IND STRL 6.5 (GLOVE) ×2 IMPLANT
GLOVE BIOGEL PI INDICATOR 6.5 (GLOVE) ×2
GLOVE SURG SS PI 8.0 STRL IVOR (GLOVE) ×2 IMPLANT
GOWN PREVENTION PLUS LG XLONG (DISPOSABLE) IMPLANT
GOWN STRL REIN XL XLG (GOWN DISPOSABLE) IMPLANT
GOWN STRL REUS W/TWL LRG LVL3 (GOWN DISPOSABLE) ×2 IMPLANT
GOWN STRL REUS W/TWL XL LVL3 (GOWN DISPOSABLE) ×2 IMPLANT
GUIDEWIRE STR DUAL SENSOR (WIRE) IMPLANT
IV NS IRRIG 3000ML ARTHROMATIC (IV SOLUTION) ×2 IMPLANT
NDL SAFETY ECLIPSE 18X1.5 (NEEDLE) IMPLANT
NEEDLE HYPO 18GX1.5 SHARP (NEEDLE)
NEEDLE HYPO 22GX1.5 SAFETY (NEEDLE) IMPLANT
NS IRRIG 500ML POUR BTL (IV SOLUTION) IMPLANT
PACK CYSTOSCOPY (CUSTOM PROCEDURE TRAY) ×2 IMPLANT
SURGILUBE 2OZ TUBE FLIPTOP (MISCELLANEOUS) ×2 IMPLANT
SYR 20CC LL (SYRINGE) IMPLANT
SYR BULB IRRIGATION 50ML (SYRINGE) ×2 IMPLANT
SYRINGE IRR TOOMEY STRL 70CC (SYRINGE) ×2 IMPLANT
TOWEL OR 17X24 6PK STRL BLUE (TOWEL DISPOSABLE) IMPLANT
UNDERPAD 30X30 INCONTINENT (UNDERPADS AND DIAPERS) ×2 IMPLANT
WATER STERILE IRR 3000ML UROMA (IV SOLUTION) IMPLANT
WATER STERILE IRR 500ML POUR (IV SOLUTION) ×2 IMPLANT

## 2014-04-15 NOTE — Anesthesia Preprocedure Evaluation (Signed)
Anesthesia Evaluation  Patient identified by MRN, date of birth, ID band Patient awake    Reviewed: Allergy & Precautions, H&P , NPO status , Patient's Chart, lab work & pertinent test results  Airway Mallampati: II TM Distance: <3 FB Neck ROM: Full    Dental no notable dental hx.    Pulmonary neg pulmonary ROS,  breath sounds clear to auscultation  Pulmonary exam normal       Cardiovascular hypertension, Pt. on medications Rhythm:Regular Rate:Normal     Neuro/Psych negative neurological ROS  negative psych ROS   GI/Hepatic negative GI ROS, Neg liver ROS,   Endo/Other  negative endocrine ROS  Renal/GU negative Renal ROS  negative genitourinary   Musculoskeletal negative musculoskeletal ROS (+)   Abdominal   Peds negative pediatric ROS (+)  Hematology negative hematology ROS (+)   Anesthesia Other Findings   Reproductive/Obstetrics negative OB ROS                           Anesthesia Physical Anesthesia Plan  ASA: II  Anesthesia Plan: General   Post-op Pain Management:    Induction: Intravenous  Airway Management Planned: LMA  Additional Equipment:   Intra-op Plan:   Post-operative Plan: Extubation in OR  Informed Consent: I have reviewed the patients History and Physical, chart, labs and discussed the procedure including the risks, benefits and alternatives for the proposed anesthesia with the patient or authorized representative who has indicated his/her understanding and acceptance.   Dental advisory given  Plan Discussed with: CRNA and Surgeon  Anesthesia Plan Comments:         Anesthesia Quick Evaluation

## 2014-04-15 NOTE — Transfer of Care (Signed)
Immediate Anesthesia Transfer of Care Note  Patient: Thomas Stuart [facility]  Procedure(s) Performed: Procedure(s) (LRB): CYSTOSCOPY , WITH PROSTATE  BIOPSY (N/A) PROSTATE ULTRASOUND/BIOPSY  (N/A)  Patient Location: PACU  Anesthesia Type: General  Level of Consciousness: awake, oriented, sedated and patient cooperative  Airway & Oxygen Therapy: Patient Spontanous Breathing and Patient connected to face mask oxygen  Post-op Assessment: Report given to PACU RN and Post -op Vital signs reviewed and stable  Post vital signs: Reviewed and stable  Complications: No apparent anesthesia complications

## 2014-04-15 NOTE — Interval H&P Note (Signed)
History and Physical Interval Note:  I discussed the possible need for a transurethral biopsy of the prostate in addition to the needle biopsy.   04/15/2014 7:21 AM  Thomas Stuart  has presented today for surgery, with the diagnosis of HEMATURIA/ELEVATED PSA  The various methods of treatment have been discussed with the patient and family. After consideration of risks, benefits and other options for treatment, the patient has consented to  Procedure(s): CYSTOSCOPY (N/A) PROSTATE ULTRASOUND/BIOPSY  (N/A) as a surgical intervention .  The patient's history has been reviewed, patient examined, no change in status, stable for surgery.  I have reviewed the patient's chart and labs.  Questions were answered to the patient's satisfaction.     Eran Windish J

## 2014-04-15 NOTE — Brief Op Note (Signed)
04/15/2014  8:16 AM  PATIENT:  Thomas Stuart  63 y.o. male  PRE-OPERATIVE DIAGNOSIS:  HEMATURIA/ELEVATED PSA  POST-OPERATIVE DIAGNOSIS:  HEMATURIA/ELEVATED PSA  PROCEDURE:  Procedure(s): CYSTOSCOPY , WITH PROSTATE  BIOPSY (N/A) PROSTATE ULTRASOUND/BIOPSY  (N/A)  SURGEON:  Surgeon(s) and Role:    * Malka So, MD - Primary  PHYSICIAN ASSISTANT:   ASSISTANTS: none   ANESTHESIA:   general  EBL:  Total I/O In: 800 [I.V.:800] Out: -   BLOOD ADMINISTERED:none  DRAINS: none   LOCAL MEDICATIONS USED:  LIDOCAINE   SPECIMEN:  Source of Specimen:  Transrectal and transurethral prostate biopsies.   DISPOSITION OF SPECIMEN:  PATHOLOGY  COUNTS:  YES  TOURNIQUET:  * No tourniquets in log *  DICTATION: .Other Dictation: Dictation Number [id number]  PLAN OF CARE: Discharge to home after PACU  PATIENT DISPOSITION:  PACU - hemodynamically stable.   Delay start of Pharmacological VTE agent (>24hrs) due to surgical blood loss or risk of bleeding: not applicable

## 2014-04-15 NOTE — Op Note (Signed)
NAMEJEROLD, YOSS               ACCOUNT NO.:  [id number]  MEDICAL RECORD NO.:  [id number]  LOCATION:                                 FACILITY:  PHYSICIAN:  Marshall Cork. Jeffie Pollock, M.D.    DATE OF BIRTH:  16-Jan-1951  DATE OF PROCEDURE:  04/15/2014 DATE OF DISCHARGE:  04/15/2014                              OPERATIVE REPORT   PROCEDURE: 1. Transrectal ultrasound with a transrectal ultrasound-guided     prostate biopsy. 2. Cystoscopy with transurethral biopsy of the prostate and     fulguration.  PREOPERATIVE DIAGNOSES:  Elevated PSA and hematuria.  POSTOPERATIVE DIAGNOSES:  Elevated PSA and hematuria.  SURGEON:  Marshall Cork. Jeffie Pollock, M.D.  ANESTHESIA:  General and MAC.  SPECIMEN:  20 transrectal prostate biopsy cores in 10 bottles and transurethral prostate biopsies.  BLOOD LOSS:  Minimal.  COMPLICATIONS:  None.  INDICATIONS:  Thomas Stuart is a 63 year old, African American male with a long history of BPH with bladder outlet obstruction and a prior TURP.  He has had an elevated PSA with negative biopsies and remains on Avodart for his markedly enlarged prostate biopsy.  His PSA has been rising on Avodart and was up to 15.56.  He actually had a prior MRI that showed no suspicious lesions but it was felt that biopsy with cystoscopy for some recent hematuria was indicated.  FINDINGS OF PROCEDURE:  He was given gentamicin and Rocephin.  He was taken to the operating room where he was given IV sedation and rolled into the left lateral decubitus position.  The 10 megahertz transrectal ultrasound probe was assembled and inserted and scanning was performed. This demonstrated a large prostate that in total measured 7.3 cm long, 6.16 cm height, 5.16 cm wide with an initial calculated volume of 132 mL.  However, there was a discrete middle lobe, so the body of the prostate was measured separately and it was 4.63 cm in length x 3.92 cm height x 5.28 cm wide with a volume of 50.2 mL and the middle  lobe was measured independently with a length of 3.34 cm, height of 2.84 cm, and width of 3.58 cm for a volume of 17.83 mL which puts his total volume closer to 70 mL.  Inspection of the prostate during this process revealed a generally normal appearing peripheral zone without hypoechoic lesions.  There were a few scattered calcification.  There was some variegated echotexture within the transitional zone.  The left transitional zone appeared to be a markedly enlarged and the origin of the large intravesical middle lobe appeared to be from the left.  The seminal vesicles were noted to be unremarkable.  Once the diagnostic scan was performed, the transrectal biopsies were performed using an 18-gauge Tru-Cut style needle.  Two cores were obtained from the right base and the mid and lateral sections and placed in a single bottle as we were limited to 10 bottles because of his [employer].  I obtained 2 biopsies each from the right base to mid, right mid, and right apical prostate followed by 2 biopsies from the right transitional zone.  This was then repeated on the left with biopsies from the base to mid  and apical prostate with biopsies from the transitional zone.  Once all 20 cores had been placed under 10 bottles, the probe was removed.  No significant bleeding was noted.  The patient was then moved to the cystoscopy table and he was then converted from MAC to a general anesthetic with laryngeal mask airway. He was placed in lithotomy position and fitted with PAS hose.  His perineum and genitalia were prepped with Betadine solution.  He was draped in usual sterile fashion.  Cystoscopy was initially performed using a 22-French scope and a 12- degree lens.  Examination revealed a normal urethra.  The external sphincter was intact.  The prostatic urethra was elongated with bilobar hyperplasia but evidence of prior resection.  There was some bleeding from the superior  sulcus partially from the biopsy, partially from passage of the scope.  Inspection of bladder revealed smooth wall without trabeculation.  The ureteral orifices were unremarkable.  There was a large left anterior middle lobe that had normal-appearing mucosa. There were no suspicious areas within the prostatic urethra for malignancy.  No bladder tumors or stones were identified.  Once initial cystoscopy was performed, the 28-French continuous flow resectoscope sheath was passed using the visual obturator.  This was fitted with an Beatrix Fetters handle with a bipolar loop and 12-degree lens and saline was used as the irrigant.  The bleeding areas from the superior sulcus were then fulgurated.  A TUR biopsy was obtained from the right prostatic urethral lateral lobe to actually expose some bleeding from the biopsy that bleeding area was then fulgurated.  I then obtained 2 slivers of tissue from the middle lobe which was the only portion of the prostate had not been able to access using the transrectal biopsy.  Once the hemostasis was achieved in this area and the specimen was retrieved, final inspection revealed a patent prostatic urethra with no active bleeding and no residual clots or tissue.  The bladder was drained and the scope was then removed.  The urethra was instilled with 10 mL of 2% lidocaine jelly.  The patient was taken down from lithotomy position.  His anesthetic was reversed.  He was moved to recovery room in stable condition.  There were no complications.     Marshall Cork. Jeffie Pollock, M.D.     JJW/MEDQ  D:  04/15/2014  T:  04/15/2014  Job:  [id number]

## 2014-04-15 NOTE — Discharge Instructions (Signed)
Cystoscopy, Care After Refer to this sheet in the next few weeks. These instructions provide you with information on caring for yourself after your procedure. Your caregiver may also give you more specific instructions. Your treatment has been planned according to current medical practices, but problems sometimes occur. Call your caregiver if you have any problems or questions after your procedure. HOME CARE INSTRUCTIONS  Things you can do to ease any discomfort after your procedure include:  Drinking enough water and fluids to keep your urine clear or pale yellow.  Taking a warm bath to relieve any burning feelings. SEEK IMMEDIATE MEDICAL CARE IF:   You have an increase in blood in your urine.  You notice blood clots in your urine.  You have difficulty passing urine.  You have the chills.  You have abdominal pain.  You have a fever or persistent symptoms for more than 2-3 days.  You have a fever and your symptoms suddenly get worse. MAKE SURE YOU:   Understand these instructions.  Will watch your condition.  Will get help right away if you are not doing well or get worse. Document Released: 02/09/2005 Document Revised: 03/25/2013 Document Reviewed: 01/14/2012 [facility] Patient Information 2015 [place], [place]. This information is not intended to replace advice given to you by your health care provider. Make sure you discuss any questions you have with your health care provider.  Post Anesthesia Home Care Instructions  Activity: Get plenty of rest for the remainder of the day. A responsible adult should stay with you for 24 hours following the procedure.  For the next 24 hours, DO NOT: -Drive a car -Paediatric nurse -Drink alcoholic beverages -Take any medication unless instructed by your physician -Make any legal decisions or sign important papers.  Meals: Start with liquid foods such as gelatin or soup. Progress to regular foods as tolerated. Avoid greasy, spicy, heavy  foods. If nausea and/or vomiting occur, drink only clear liquids until the nausea and/or vomiting subsides. Call your physician if vomiting continues.  Special Instructions/Symptoms: Your throat may feel dry or sore from the anesthesia or the breathing tube placed in your throat during surgery. If this causes discomfort, gargle with warm salt water. The discomfort should disappear within 24 hours. Transrectal Ultrasound-Guided Biopsy A transrectal ultrasound-guided biopsy is a procedure to remove samples of tissue from your prostate using ultrasound images to guide the procedure. The procedure is usually done to evaluate the prostate gland of men who have an elevated prostate-specific antigen (PSA). PSA is a blood test to screen for prostate cancer. The biopsy samples are taken to check for prostate cancer.  LET [facility] CARE PROVIDER KNOW ABOUT:  Any allergies you have.  All medicines you are taking, including vitamins, herbs, eye drops, creams, and over-the-counter medicines.  Previous problems you or members of your family have had with the use of anesthetics.  Any blood disorders you have.  Previous surgeries you have had.  Medical conditions you have. RISKS AND COMPLICATIONS Generally, this is a safe procedure. However, as with any procedure, problems can occur. Possible problems include:  Infection of your prostate.  Bleeding from your rectum or blood in your urine.  Difficulty urinating.  Nerve damage (this is usually temporary).  Damage to surrounding structures such as blood vessels, organs, and muscles, which would require other procedures. BEFORE THE PROCEDURE  Do not eat or drink anything after midnight on the night before the procedure or as directed by your health care provider.  Take medicines only  as directed by your health care provider.  Your health care provider may have you stop taking certain medicines 5-7 days before the procedure.  You will be given an  enema before the procedure. During an enema, a liquid is injected into your rectum to clear out waste.  You may have lab tests the day of your procedure.   Plan to have someone take you home after the procedure. PROCEDURE   You will be given medicine to help you relax (sedative) before the procedure. An IV tube will be inserted into one of your veins and used to give fluids and medicine.  You will be given antibiotic medicine to reduce the risk of an infection.  You will be placed on your side for the procedure.  A probe with lubricated gel will be placed into your rectum, and images will be taken of your prostate and surrounding structures.  Numbing medicine will be injected into the prostate before the biopsy samples are taken.  A biopsy needle will then be inserted and guided to your prostate with the use of the ultrasound images.  Samples of prostate tissue will be taken, and the needle will then be removed.  The biopsy samples will be sent to a lab to be analyzed. Results are usually back in 2-3 days. AFTER THE PROCEDURE  You will be taken to a recovery area where you will be monitored.  You may have some discomfort in the rectal area. You will be given pain medicines to control this.  You may be allowed to go home the same day, or you may need to stay in the hospital overnight. Document Released: 12/07/2013 Document Reviewed: 03/11/2013 [facility] Patient Information 2015 [place]. This information is not intended to replace advice given to you by your health care provider. Make sure you discuss any questions you have with your health care provider.

## 2014-04-16 NOTE — Anesthesia Postprocedure Evaluation (Signed)
  Anesthesia Post-op Note  Patient: Thomas Stuart [facility]  Procedure(s) Performed: Procedure(s) (LRB): CYSTOSCOPY , WITH PROSTATE  BIOPSY (N/A) PROSTATE ULTRASOUND/BIOPSY  (N/A)  Patient Location: PACU  Anesthesia Type: General  Level of Consciousness: awake and alert   Airway and Oxygen Therapy: Patient Spontanous Breathing  Post-op Pain: mild  Post-op Assessment: Post-op Vital signs reviewed, Patient's Cardiovascular Status Stable, Respiratory Function Stable, Patent Airway and No signs of Nausea or vomiting  Last Vitals:  Filed Vitals:   04/15/14 0950  BP: 138/75  Pulse: 66  Temp: 36.6 C  Resp: 16    Post-op Vital Signs: stable   Complications: No apparent anesthesia complications

## 2014-04-19 ENCOUNTER — Encounter (HOSPITAL_BASED_OUTPATIENT_CLINIC_OR_DEPARTMENT_OTHER): Payer: Self-pay | Admitting: Urology

## 2014-04-20 ENCOUNTER — Other Ambulatory Visit: Payer: Self-pay | Admitting: Urology

## 2014-04-20 DIAGNOSIS — C61 Malignant neoplasm of prostate: Secondary | ICD-10-CM

## 2014-04-22 ENCOUNTER — Ambulatory Visit (HOSPITAL_COMMUNITY)
Admission: RE | Admit: 2014-04-22 | Discharge: 2014-04-22 | Disposition: A | Discharge status: Home / Self Care | Payer: BC Managed Care – PPO | Source: Ambulatory Visit | Attending: Urology | Admitting: Urology

## 2014-04-22 DIAGNOSIS — C61 Malignant neoplasm of prostate: Secondary | ICD-10-CM

## 2014-04-22 DIAGNOSIS — T1490XA Injury, unspecified, initial encounter: Secondary | ICD-10-CM | POA: Diagnosis present

## 2014-04-22 DIAGNOSIS — M5106 Intervertebral disc disorders with myelopathy, lumbar region: Secondary | ICD-10-CM | POA: Diagnosis not present

## 2014-04-22 DIAGNOSIS — M129 Arthropathy, unspecified: Secondary | ICD-10-CM | POA: Insufficient documentation

## 2014-04-22 DIAGNOSIS — M545 Low back pain, unspecified: Secondary | ICD-10-CM | POA: Diagnosis present

## 2014-04-22 MED ORDER — TECHNETIUM TC 99M MEDRONATE IV KIT
26.4000 | PACK | Freq: Once | INTRAVENOUS | Status: AC | PRN
  Administered 2014-04-22: 26.4 via INTRAVENOUS

## 2014-05-24 ENCOUNTER — Telehealth: Payer: Self-pay | Admitting: *Deleted

## 2014-05-24 NOTE — Telephone Encounter (Signed)
Called patient to introduce myself as Prostate Oncology Navigator and coordinator of the Prostate [place], and to: 1)  confirm his referral for the clinic on 06/01/14 with an arrival time of 12:30,  2)  explain format of clinic,  3)  confirm his understanding of [place] location, 4)  explain arrival and registration procedure, and   5)  indicate I am mailing an [place] to him that contains medical information forms which he should complete and bring with him on the day of clinic. I confirmed his mailing address.  I provided my phone number and encouraged him to call me if he has any questions after receiving the Information Packet or prior to my call the day before clinic.  He verbalized understanding of information provided.  Gayleen Orem, RN, BSN, [place] at [place] [phone]

## 2014-05-28 ENCOUNTER — Encounter: Payer: Self-pay | Admitting: Radiation Oncology

## 2014-05-28 NOTE — Progress Notes (Signed)
GU Location of Tumor / Histology: prostate adenocarcinoma  If Prostate Cancer, Gleason Score is (4 + 3 ) and PSA is (15.56 on 03/26/14) 01/20/14 PSA 13.43 03/02/13 PSA 9.91  Thomas Stuart presented 2005 with signs/symptoms of: Elevated PSA of 11.5, biopsy negative, TURP 2008  Biopsies of  prostate revealed:  04/15/14  Volume 70 mL Diagnosis 1. Prostate, needle biopsy(ies), right base x2 - BENIGN PROSTATIC TISSUE WITH GLANDULAR ATROPHY. - NO EVIDENCE OF MALIGNANCY. 2. Prostate, needle biopsy(ies), right base to mid x2 - BENIGN PROSTATIC TISSUE. - NO EVIDENCE OF MALIGNANCY. 3. Prostate, needle biopsy(ies), right mid x2 - BENIGN PROSTATIC TISSUE WITH GLANDULAR ATROPHY. - NO EVIDENCE OF MALIGNANCY. 4. Prostate, needle biopsy(ies), right apex x2 - BENIGN PROSTATIC TISSUE WITH GLANDULAR ATROPHY AND ASSOCIATED CHRONIC INFLAMMATION. - NO EVIDENCE OF MALIGNANCY. 5. Prostate, needle biopsy(ies), right transitional zone x2 - BENIGN PROSTATIC TISSUE WITH GLANDULAR ATROPHY AND ASSOCIATED CHRONIC INFLAMMATION. - NO EVIDENCE OF MALIGNANCY. 6. Prostate, needle biopsy(ies), left base x2 - PROSTATIC ADENOCARCINOMA, GLEASON SCORE 4+4=8, INVOLVING LESS THAN 5% OF THE TISSUE, ONE OF TWO CORES. - PERINEURAL INVASION PRESENT. 7. Prostate, needle biopsy(ies), left base to mid x2 - BENIGN PROSTATIC TISSUE WITH GLANDULAR ATROPHY - NO EVIDENCE OF MALIGNANCY. 8. Prostate, needle biopsy(ies), left mid x2 - PROSTATIC ADENOCARCINOMA, GLEASON'S SCORE 4+3=7, INVOLVING 30% OF THE TISSUE, TWO OF THREE CORES. - NO PERINEURAL INVASION IDENTIFIED. 9. Prostate, needle biopsy(ies), left apex x2 - BENIGN PROSTATIC TISSUE AND FIBROMUSCULAR TISSUE, NO EVIDENCE OF MALIGNANCY. 10. Prostate, needle biopsy(ies), left transition zone x2 - BENIGN PROSTATIC TISSUE WITH GLANDULAR ATROPHY AND ASSOCIATED CHRONIC INFLAMMATION. - NO EVIDENCE OF MALIGNANCY. 11. Prostate, TUR - POLYPOID FRAGMENTS OF INFLAMED SQUAMOUS MUCOSA WITH UNDERLYING  ATROPHIC     PROSTATIC TISSUE AND FIBROMUSCULAR HYPERPLASIA. - NO EVIDENCE OF MALIGNANCY.  Past/Anticipated interventions by urology, if any: biopsies, PSA, surveillance  Past/Anticipated interventions by medical oncology, if any: none, will see Dr Alen Blew in prostate [place] on 06/01/14  Weight changes, if any: None  Bowel/Bladder complaints, if any:  Urinary control.  Nocturia x 1  Nausea/Vomiting, if any: None  Pain issues, if any:  None  SAFETY ISSUES:  Prior radiation? no  Pacemaker/ICD? no  Possible current pregnancy? na  Is the patient on methotrexate? no  Current Complaints / other details:  Single, retired Administrator,  [place] [address], history of 2 negative prostate biopsies No family history prostate cancer. Dr Jeffie Pollock states pt is candidate for radiation therapy with external beam. He believes seed implant would be problematic due to prostate volume. He would need androgen 8-24 months.

## 2014-05-31 ENCOUNTER — Telehealth: Payer: Self-pay | Admitting: *Deleted

## 2014-05-31 NOTE — Telephone Encounter (Signed)
Called patient to see if he had any questions prior to his attendance at tomorrow's Prostate [place].  He stated he did not.  He confirmed his understanding of a 12:30 arrival time, that he would bring the completed health information forms he received in the mailing I sent, confirmed his understanding of the [facility] location and valet transportation to the [facility] entrance.  He verbalized understanding.  Gayleen Orem, RN, BSN, [place] at [place] [phone]

## 2014-06-01 ENCOUNTER — Ambulatory Visit
Admission: RE | Admit: 2014-06-01 | Discharge: 2014-06-01 | Disposition: A | Discharge status: Home / Self Care | Payer: BC Managed Care – PPO | Source: Ambulatory Visit | Attending: Radiation Oncology | Admitting: Radiation Oncology

## 2014-06-01 ENCOUNTER — Encounter: Payer: Self-pay | Admitting: *Deleted

## 2014-06-01 ENCOUNTER — Encounter: Payer: Self-pay | Admitting: Specialist

## 2014-06-01 ENCOUNTER — Encounter: Payer: Self-pay | Admitting: Radiation Oncology

## 2014-06-01 ENCOUNTER — Ambulatory Visit (HOSPITAL_BASED_OUTPATIENT_CLINIC_OR_DEPARTMENT_OTHER): Payer: BC Managed Care – PPO | Admitting: Oncology

## 2014-06-01 VITALS — BP 144/84 | HR 90 | Temp 98.6°F | Ht 71.0 in | Wt 223.0 lb

## 2014-06-01 DIAGNOSIS — R339 Retention of urine, unspecified: Secondary | ICD-10-CM | POA: Diagnosis not present

## 2014-06-01 DIAGNOSIS — C61 Malignant neoplasm of prostate: Secondary | ICD-10-CM | POA: Diagnosis not present

## 2014-06-01 DIAGNOSIS — R319 Hematuria, unspecified: Secondary | ICD-10-CM | POA: Insufficient documentation

## 2014-06-01 DIAGNOSIS — Z7982 Long term (current) use of aspirin: Secondary | ICD-10-CM | POA: Diagnosis not present

## 2014-06-01 DIAGNOSIS — I10 Essential (primary) hypertension: Secondary | ICD-10-CM | POA: Insufficient documentation

## 2014-06-01 DIAGNOSIS — N529 Male erectile dysfunction, unspecified: Secondary | ICD-10-CM | POA: Diagnosis not present

## 2014-06-01 DIAGNOSIS — N400 Enlarged prostate without lower urinary tract symptoms: Secondary | ICD-10-CM | POA: Insufficient documentation

## 2014-06-01 HISTORY — DX: Unspecified thoracic, thoracolumbar and lumbosacral intervertebral disc disorder: M51.9

## 2014-06-01 HISTORY — DX: Malignant neoplasm of prostate: C61

## 2014-06-01 HISTORY — DX: Retention of urine, unspecified: R33.9

## 2014-06-01 NOTE — Progress Notes (Signed)
[facility] NEW PATIENT EVALUATION  Name: Thomas Stuart MRN: [id number]  Date:   06/01/2014           DOB: 1950-12-04  Status: outpatient   CC: Woody Seller, MD  Raynelle Bring, MD , Dr. Irine Seal   REFERRING PHYSICIAN: Raynelle Bring, MD, Dr. 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 male who is seen today at the prostate multidisciplinary clinic through the courtesy of Dr. Jeffie Pollock and Dr. Alinda Money for evaluation of his stage T1c high risk adenocarcinoma prostate.  His PSA was found to be 15.56 this past August while on finasteride for recurrent BPH. He had negative biopsies in 2008 and negative TUR pathology. His MRI in March 2014 was negative. He underwent ultrasound-guided biopsies by Dr. Jeffie Pollock on 04/15/2014. He was found to have Gleason 8 (4+4) involving less than 5% of one of 2 cores from the left base. He also had a Gleason 7 (4+3) involving 30% of 3 cores from the left mid gland. His prostate volume was 70 mL. Of note is that 18 mL was in a left anterior intravesical middle lobe. His IPSS score is 4 today. His staging workup included a CT scan and bone scan which were without evidence for metastatic disease. He does have erectile dysfunction.   PREVIOUS RADIATION THERAPY: No   PAST MEDICAL HISTORY:  has a past medical history of BPH (benign prostatic hyperplasia); Hypertension; Personal history of colonic polyps (2004, 2009); Erectile dysfunction; Nephrolithiasis; Hematuria; PSA elevation; Arthritis; Prostate cancer (04/15/14); Lumbar disc disease; and Urinary retention.     PAST SURGICAL HISTORY:  Past Surgical History  Procedure Laterality Date  . Inguinal hernia repair  2011    left  . Transurethral resection of prostate  2007  . Lithotripsy  2008    kidney stone  . Colonoscopy  2004, 03/2008, 06/15/2011    2004 - 6 mm polyp, 2009 4 adenomas max 10  mm 2012  . Cystoscopy  2010    with fulgeration  . Cystoscopy  2006     w/ stone removal  . Cystoscopy N/A 04/15/2014    Procedure: CYSTOSCOPY , WITH PROSTATE  BIOPSY;  Surgeon: Malka So, MD;  Location: [facility];  Service: Urology;  Laterality: N/A;  . Prostate biopsy N/A 04/15/2014    Procedure: PROSTATE ULTRASOUND/BIOPSY ;  Surgeon: Malka So, MD;  [facility];  Service: Urology;  Laterality: N/A;  . Prostate biopsy  01/2007    benign, TURP     FAMILY HISTORY: family history includes Breast cancer (age of onset: 36) in his mother; Cancer (age of onset: 56) in his father; Dementia in his mother.    SOCIAL HISTORY:  reports that he has quit smoking. He has never used smokeless tobacco. He reports that he drinks alcohol. He reports that he does not use illicit drugs. 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   ALLERGIES: Review of patient's allergies indicates no known allergies.   MEDICATIONS:  Current Outpatient Prescriptions  Medication Sig Dispense Refill  . Ascorbic Acid (VITAMIN C) 1000 MG tablet Take 1,000 mg by mouth daily.        Marland Kitchen aspirin 81 MG tablet Take 81 mg by mouth daily.        . benazepril-hydrochlorthiazide (LOTENSIN HCT) 10-12.5 MG per tablet Take 1 tablet by mouth every morning.       Marland Kitchen  finasteride (PROSCAR) 5 MG tablet Take 5 mg by mouth daily.      . meloxicam (MOBIC) 15 MG tablet Take 15 mg by mouth daily as needed for pain. Takes 5mg  daily      . Multiple Vitamins-Minerals (MULTIVITAMIN WITH MINERALS) tablet Take 1 tablet by mouth daily.        . Omega-3 Fatty Acids (FISH OIL) 1000 MG CAPS Take by mouth.      Marland Kitchen PRENAT-FECBN-FEBISG-FA-FISHOIL PO Take 1,000 mg by mouth daily.      . Saw Palmetto, Serenoa repens, 320 MG CAPS Take 1 tablet by mouth daily.      . tadalafil (CIALIS) 10 MG tablet Take 10 mg by mouth as needed.        . cholecalciferol (VITAMIN D) 1000 UNITS tablet Take 1,000 Units by mouth daily.          No current facility-administered medications for this encounter.     REVIEW OF SYSTEMS:  Pertinent items are noted in HPI.    PHYSICAL EXAM:  height is 5\' 11"  (1.803 m) and weight is 223 lb (101.152 kg). His temperature is 98.6 F (37 C). His blood pressure is 144/84 and his pulse is 90.   Alert and oriented 63 year old African-American male appearing his stated age. Rectal examination: The prostate gland is moderately enlarged and is without focal induration or nodularity.   LABORATORY DATA:  Lab Results  Component Value Date   WBC 10.0 06/06/2010   HGB 16.0 04/15/2014   HCT 47.0 04/15/2014   MCV 88.5 06/06/2010   PLT 293 06/06/2010   Lab Results  Component Value Date   NA 139 04/15/2014   K 3.6* 04/15/2014   CL 103 04/15/2014   CO2 26 06/06/2010   No results found for this basename: ALT, AST, GGT, ALKPHOS, BILITOT    PSA 15.56 from 03/26/2014.    IMPRESSION: StageT1c high risk adenocarcinoma the prostate. I explained to the patient that his prognosis related to his stage, PSA level, and Gleason score. His corrected PSA of 15.56 is 30.1,  placing his PSA level in the unfavorable category although his gland volume is 70 mL with an intravesical middle lobe of 18 mL (which may be contributing to his PSA level). His stage is favorable but his Gleason score of 8 is distinctly unfavorable. We discussed surgery versus radiation therapy. We discussed the potential acute and late toxicities of external beam/IMRT. If he wants to consider radiation therapy,  he should have excision of his intravesical middle lobe. Otherwise, I would favor surgery over radiation therapy. If he wanted radiation therapy, I would recommend androgen deprivation therapy which he could avoid with a robotic prostatectomy. He is most interested in robotic surgery which I think is a good choice for him.  PLAN: As discussed above.  I spent 30 minutes minutes face to face with the patient and more than 50% of that time  was spent in counseling and/or coordination of care.

## 2014-06-01 NOTE — Progress Notes (Signed)
[facility] Psychosocial Distress Screening Spiritual Care and Wholeness  Chaplain followed distress screening protocol in Prostate [facility].  The patient scored a 0 on the Psychosocial Distress Thermometer which indicates None distress. Chaplain assessed for distress and other psychosocial needs. Patient stated that he had no distress at the time of the visit and did not need to check anything.    Clinical Social Worker follow up needed: No.  If yes, follow up plan:  [place] 06/01/2014  Screening Type Initial Screening  Mark the number that describes how much distress you have been experiencing in the past week 0  Referral to support programs Yes  Other Patient stated that he did not need to check anything.    Chaplain provided patient with education about the [place] resources and programs--particularly the Prostate Support Group. Patient has good spiritual and family support. He sings in a quartet and is very active in his church, [employer]. His sister is a cancer patient also and he took some information for her. He seemed much more concerned about her than himself.  Epifania Gore, PhD, [place]

## 2014-06-01 NOTE — Consult Note (Signed)
Chief Complaint  Prostate Cancer   Reason For Visit  Reason for consult: To discuss treatment options for prostate cancer. Physician requesting consult: Dr. Irine Stuart PCP: Dr. Kathryne Eriksson Location of consult: [place]   History of Present Illness  Thomas Stuart is a 63 year old gentleman with a history of BPH and an elevated PSA who has undergone multiple prior negative biopsies in the past. He underwent a TURP in 2008 also with negative pathology.  He has been treated with finasteride for recurrent LUTS and hematuria due to BPH.  His PSA continued to rise and an MRI was performed in March 2014 which was unremarkable. 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 He was noted to have a very large anterior intravesical component of his BPH. Staging studies were performed on 04/22/14 including a bone scan and CT scan.  Bone scan imaging did demonstrate uptake at L3 and CT findings were consistent with degenerative changes.    ** His medical comorbidities include a history of hypertension and lumbar disc disease.  TNM stage: cT1c N0 M0 PSA: 15.56 (on 5 ARI therapy) Gleason score: 4+4=8 Biopsy (04/15/14): 3/20 cores positive -- Left mid (2/3 cores, 4+3=7, 30% of tissue), L base (< 5%, 4+4=8) Prostate volume: 70 cc  Urinary function: IPSS is 3. Erectile function: SHIM score is 14.  He can achieve erections with Cialis.   Past Medical History  1. History of Disc degeneration, lumbar (M51.36)  2. History of Gross hematuria (R31.0)  3. History of hypertension (Z86.79)  4. History of kidney stones (Z87.442)  5. History of Urinary retention (R33.9)  Surgical History  1. History of Biopsy Of The Prostate Needle  2. History of Biopsy Of The Prostate Needle  3. History of  Cystoscopy With Fulguration  4. History of Hernia Repair  5. History of Hernia Repair  6. History of Lithotripsy  7. History of Transurethral Resection Of Prostate (TURP)   He has undergone bilateral inguinal hernia repairs with mesh on the left side.   Current Meds  1. Aspirin 81 MG Oral Tablet;  Therapy: (Recorded:26Aug2015) to Recorded  2. Benazepril-Hydrochlorothiazide 20-12.5 MG Oral Tablet;  Therapy: (Recorded:10Nov2014) to Recorded  3. Cialis 20 MG Oral Tablet; TAKE 1 TABLET As Directed PRN;  Therapy: 24Oct2008 to (Last Rx:22Apr2013)  Requested for: 22Apr2013 Ordered  4. Diazepam 10 MG Oral Tablet; Take 1 -2 po one hour prior to procedure;  Therapy: 26Aug2015 to (Last Rx:26Aug2015) Ordered  5. Finasteride 5 MG Oral Tablet; TAKE 1 TABLET DAILY AS DIRECTED;  Therapy: 30Apr2014 to (Evaluate:06May2016)  Requested for: [id number]; Last  Rx:12May2015 Ordered  6. Levofloxacin 500 MG Oral Tablet; Take one tablet daily starting day before procedure;  Therapy: 26Aug2015 to (Evaluate:29Aug2015)  Requested for: 26Aug2015; Last  Rx:26Aug2015 Ordered  7. Meloxicam 7.5 MG Oral Tablet;  Therapy: 30Apr2014 to Recorded  8. Multi-Vitamin TABS;  Therapy: (Recorded:25Jun2010) to Recorded  9. Vitamin B-12 TABS;  Therapy: (Recorded:25Jun2010) to Recorded  10. Vitamin C TABS;   Therapy: (Recorded:25Jun2010) to Recorded  11. Vitamin D TABS;   Therapy: (Recorded:22Apr2013) to Recorded  12. Vitamin E TABS;   Therapy: (Recorded:30Apr2014) to Recorded  Allergies  1. No Known Drug Allergies  Family History  1. Family history of malignant neoplasm of male breast (Z80.3) : Mother  2. Family history of senile dementia (Z82.0) : Mother  3. Denied: Family history of Prostate Cancer  Social History   Denied: Alcohol Use   Marital History - Single   Never A Smoker   Occupation:   Retired From Work  Review of Systems AU Complete-Male: Genitourinary, constitutional, skin, eye, otolaryngeal,  hematologic/lymphatic, cardiovascular, pulmonary, endocrine, musculoskeletal, gastrointestinal, neurological and psychiatric system(s) were reviewed and pertinent findings if present are noted.    Physical Exam Constitutional: Well nourished and well developed . No acute distress.  ENT:. The ears and nose are normal in appearance.  Neck: The appearance of the neck is normal and no neck mass is present.  Pulmonary: No respiratory distress, normal respiratory rhythm and effort and clear bilateral breath sounds.  Cardiovascular: Heart rate and rhythm are normal . No peripheral edema.  Abdomen: The abdomen is rounded.  Rectal: Prostate size is estimated to be 55 g. The prostate has no nodularity and is not indurated.  Lymphatics: The femoral and inguinal nodes are not enlarged or tender.  Skin: Normal skin turgor, no visible rash and no visible skin lesions.  Neuro/Psych:. Mood and affect are appropriate.    Results/Data Selected Results  AU CT-HEMATURIA PROTOCOL 02Sep2015 12:00AM Thomas Stuart   Test Name Result Flag Reference  AU CT-HEMATURIA PROTOCOL (Report)    ** RADIOLOGY REPORT BY [facility] **   CLINICAL DATA: Gross hematuria. Urolithiasis. Back pain.  EXAM: CT ABDOMEN AND PELVIS WITHOUT AND WITH CONTRAST  TECHNIQUE: Multidetector CT imaging of the abdomen and pelvis was performed following the standard protocol before and following the bolus administration of intravenous contrast.  CONTRAST: 125 mL Isovue 300  COMPARISON: Prostate MRI on 06/1913 and AP CT on 12/24/2008  FINDINGS: Kidneys/Urinary Tract: No evidence of urolithiasis or hydronephrosis. No evidence of complex cystic or solid renal masses. Probable tiny sub-cm cyst noted in the lower pole the left kidney which is too small to characterize. No masses or filling defects are seen involving the renal collecting systems or lower urinary tract. Marked median lobe hypertrophy of the prostate gland is  seen with indentation of the urinary bladder, which is stable. No intrinsic bladder mass visualized.  Adrenal Glands: No mass identified.  Liver: Tiny left hepatic lobe cyst remains stable. No liver masses are identified.  Gallbladder/Biliary: Unremarkable.  Pancreas: No mass, inflammatory changes, or other parenchymal abnormality identified.  Spleen: Within normal limits in size and appearance.  Lymph Nodes: No pathologically enlarged lymph nodes identified.  Bowel/Peritoneum: No evidence of bowel wall thickening, mass, or obstruction.  Pelvic/Reproductive Organs: No mass or other significant abnormality identified.  Vascular: No evidence of abdominal aortic aneurysm.  Musculoskeletal: No suspicious bone lesions identified.  Other: Small bilateral inguinal hernias containing only fat remains stable.  IMPRESSION: No radiographic evidence of urinary tract carcinoma, urolithiasis, or hydronephrosis.  Stable marked median lobe hypertrophy of the prostate, with indentation of urinary bladder.   Electronically Signed  By: Earle Gell M.D.  On: 04/07/2014 10:20     I have reviewed his MRI from 2014, recent CT scan, bone scan, PSA results, pathology report and slides and reviewed all of these pieces of information independently today.  Assessment  1. Prostate cancer (C61)  Plan  1. PT/OT Referral Referral  Referral  Status: Hold For - Appointment,PreCert,Date of  Service,Physical Therapy  Requested for: 27Oct2015  Discussion/Summary  1.  Prostate cancer: Mr. Strawderman feels very well informed after his discussions today and has met with both Dr. Alen Blew and  Dr. Valere Dross prior to his meeting with me today.  He does wish to proceed with surgical treatment.  Considering his significant intravesical component of his prostate, this would appear to likely be his most prudent option for therapy.   The patient was counseled about the natural history of prostate cancer and the  standard treatment options that are available for prostate cancer. It was explained to him how his age and life expectancy, clinical stage, Gleason score, and PSA affect his prognosis, the decision to proceed with additional staging studies, as well as how that information influences recommended treatment strategies. We discussed the roles for active surveillance, radiation therapy, surgical therapy, androgen deprivation, as well as ablative therapy options for the treatment of prostate cancer as appropriate to his individual cancer situation. We discussed the risks and benefits of these options with regard to their impact on cancer control and also in terms of potential adverse events, complications, and impact on quiality of life particularly related to urinary, bowel, and sexual function. The patient was encouraged to ask questions throughout the discussion today and all questions were answered to his stated satisfaction. In addition, the patient was provided with and/or directed to appropriate resources and literature for further education about prostate cancer and treatment options.   We discussed surgical therapy for prostate cancer including the different available surgical approaches. We discussed, in detail, the risks and expectations of surgery with regard to cancer control, urinary control, and erectile function as well as the expected postoperative recovery process. Additional risks of surgery including but not limited to bleeding, infection, hernia formation, nerve damage, lymphocele formation, bowel/rectal injury potentially necessitating colostomy, damage to the urinary tract resulting in urine leakage, urethral stricture, and the cardiopulmonary risks such as myocardial infarction, stroke, death, venothromboembolism, etc. were explained. The risk of open surgical conversion for robotic/laparoscopic prostatectomy was also discussed.   He understands that he will have a high risk for erectile  dysfunction and accepts this risk.  He understands the surgery will likely be more complex and require a longer procedure with bladder neck reconstruction.  He also understands that he possibly has an increased risk of open surgical conversion.  He does wish to proceed and will be scheduled for a bilateral nerve sparing robotic-assisted laparoscopic radical prostatectomy and bilateral pelvic lymphadenectomy.  Cc: Dr. Irine Stuart Dr. Kathryne Eriksson Dr. Arloa Koh Dr. Zola Button  A total of 60 minutes were spent in the overall care of the patient today with 45 minutes in direct face to face consultation.    Signatures Electronically signed by : Raynelle Bring, M.D.; Jun 01 2014  5:29PM EST

## 2014-06-01 NOTE — Progress Notes (Signed)
Met with patient as part of Prostate MDC.  Reintroduced my role as his navigator and encouraged him to call as he proceeds with treatments and appointments at [facility].  Provided the accompanying Care Plan Summary:    Care Plan Summary  Name: Thomas Stuart DOB: 1951-04-21  Your Medical Team:   Urologist -  Dr. Raynelle Bring, [facility]  Radiation Oncologist - Dr. Arloa Koh, [facility]   Medical Oncologist - Dr. Zola Button, [place]  Recommendations: 1) Prostatectomy. * This recommendation is based on information available as of today's consult.      Recommendations may change depending on the results of further tests or exams.  Next Steps: 1) You will be contacted by Dr. Lynne Logan office.   When appointments need to be scheduled, you will be contacted by [facility] and/or [facility].  Questions?  Please do not hesitate to call Gayleen Orem, RN, BSN, [facility] at [phone] with any questions or concerns.  Liliane Channel is Counsellor and is available to assist you while you're receiving your medical care at [facility].  I encouraged him to call me with any questions or concerns as his treatments progress.  He indicated understanding.  Gayleen Orem, RN, BSN, [facility] [place] [phone]

## 2014-06-01 NOTE — Progress Notes (Signed)
Reason for Referral: Prostate cancer.   HPI: 63 year old gentleman currently of [place] where he lived the majority of his life. 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 He was followed routinely for BPH by Dr. Jeffie Pollock and have had a TUR in the past without any evidence of malignancy. Most recently his PSA was up to 13.43 and underwent a TUR on 04/15/2014. The pathology showed prostate cancer with a Gleason score 4+3 in the left base with 2 cores in the left mid section of the Gleason score 4+3 =. He had a single core of the Gleason score 4+4 = 8. He has very little urinary symptoms at this time and no evidence of metastatic disease. He had a bone scan on 04/22/2014 which showed an abnormal radiotracer uptake in the L3 region. CT scan images however showed degenerative arthritis.  Clinically, he is asymptomatic. He does not report any chest pain shortness of breath or cough. Does not report any lower extremity swelling or PND. He does not report any fevers, sweats, chills. He does not report any nausea, vomiting or abdominal pain. He does not report any hematuria or dysuria. He does not report any frequency urgency or hesitancy. Rest of his review of systems unremarkable.   Past Medical History  Diagnosis Date  . BPH (benign prostatic hyperplasia)   . Hypertension   . Personal history of colonic polyps 2004, 2009    adenomas  . Erectile dysfunction   . Nephrolithiasis   . Hematuria   . PSA elevation   . Arthritis     lower back  . Prostate cancer 04/15/14    Gleason 4+3=7, volume 70 mL  . Lumbar disc disease   . Urinary retention   :  Past Surgical History  Procedure Laterality Date  . Inguinal hernia repair  2011    left  . Transurethral resection of prostate  2007  . Lithotripsy  2008    kidney stone  . Colonoscopy  2004, 03/2008, 06/15/2011    2004 - 6 mm polyp, 2009 4 adenomas max 10 mm 2012  . Cystoscopy  2010    with fulgeration   . Cystoscopy  2006     w/ stone removal  . Cystoscopy N/A 04/15/2014    Procedure: CYSTOSCOPY , WITH PROSTATE  BIOPSY;  Surgeon: Malka So, MD;  Location: [facility];  Service: Urology;  Laterality: N/A;  . Prostate biopsy N/A 04/15/2014    Procedure: PROSTATE ULTRASOUND/BIOPSY ;  Surgeon: Malka So, MD;  Location: [facility];  Service: Urology;  Laterality: N/A;  . Prostate biopsy  01/2007    benign, TURP  :  Current outpatient prescriptions:Ascorbic Acid (VITAMIN C) 1000 MG tablet, Take 1,000 mg by mouth daily.  , Disp: , Rfl: ;  aspirin 81 MG tablet, Take 81 mg by mouth daily.  , Disp: , Rfl: ;  benazepril-hydrochlorthiazide (LOTENSIN HCT) 10-12.5 MG per tablet, Take 1 tablet by mouth every morning. , Disp: , Rfl: ;  cholecalciferol (VITAMIN D) 1000 UNITS tablet, Take 1,000 Units by mouth daily.  , Disp: , Rfl:  finasteride (PROSCAR) 5 MG tablet, Take 5 mg by mouth daily., Disp: , Rfl: ;  meloxicam (MOBIC) 15 MG tablet, Take 15 mg by mouth daily as needed for pain. Takes 5mg  daily, Disp: , Rfl: ;  Multiple Vitamins-Minerals (MULTIVITAMIN WITH MINERALS) tablet, Take 1 tablet by mouth daily.  , Disp: , Rfl: ;  Omega-3 Fatty  Acids (FISH OIL) 1000 MG CAPS, Take by mouth., Disp: , Rfl:  PRENAT-FECBN-FEBISG-FA-FISHOIL PO, Take 1,000 mg by mouth daily., Disp: , Rfl: ;  Saw Palmetto, Serenoa repens, 320 MG CAPS, Take 1 tablet by mouth daily., Disp: , Rfl: ;  tadalafil (CIALIS) 10 MG tablet, Take 10 mg by mouth as needed.  , Disp: , Rfl: :  No Known Allergies:  Family History  Problem Relation Age of Onset  . Breast cancer Mother 52  . Dementia Mother   . Cancer Father 70    prostate  :  History   Social History  . Marital Status: Single    Spouse Name: N/A    Number of Children: N/A  . Years of Education: N/A   Occupational History  . Not on file.   Social History Main Topics  . Smoking status: Former Research scientist (life sciences)  . Smokeless tobacco: Never Used  .  Alcohol Use: Yes     Comment: occasionaly  . Drug Use: No  . Sexual Activity: Not on file   Other Topics Concern  . Not on file   Social History Narrative  . No narrative on file  :  Pertinent items are noted in HPI.  Exam: There were no vitals taken for this visit. General appearance: alert and cooperative Nose: Nares normal. Septum midline. Mucosa normal. No drainage or sinus tenderness. Neck: no adenopathy Back: negative Resp: clear to auscultation bilaterally Cardio: regular rate and rhythm, S1, S2 normal, no murmur, click, rub or gallop GI: soft, non-tender; bowel sounds normal; no masses,  no organomegaly Extremities: extremities normal, atraumatic, no cyanosis or edema Pulses: 2+ and symmetric Skin: Skin color, texture, turgor normal. No rashes or lesions Lymph nodes: Cervical, supraclavicular, and axillary nodes normal.    Assessment and Plan:   63 year old gentleman who is in excellent health and shape diagnosed with prostate cancer in the setting of a long-standing BPH. He had Gleason score 4+3 = 7 in the bulk of the course. His case was discussed extensively in the prostate cancer multidisciplinary clinic. Imaging studies including a bone scan and CT scan were reviewed today by radiology. It appears to have hypertrophy of the prostate with indentation of the urinary bladder because of it. The pathology was also discussed with the reviewing pathologist today as a part of the multidisciplinary discussion. Options of treatments were discussed with the patient and it is felt that given the location and the size of his prostate, his current health status as well as the aggressive nature of this cancer who would be better served with a radical prostatectomy as a definitive option. It seems to be the patient is on board without option although alternative option with radiation therapy with androgen deprivation was discussed as well. He will have a discussion with Dr. Alinda Money as  well today and will make his final decision in the near future.

## 2014-06-02 ENCOUNTER — Other Ambulatory Visit: Payer: Self-pay | Admitting: Urology

## 2014-06-02 NOTE — Progress Notes (Signed)
Please put orders in Epic surgery 06-14-14 pre op 06-07-14 Thanks

## 2014-06-03 ENCOUNTER — Encounter (HOSPITAL_COMMUNITY): Payer: Self-pay | Admitting: Pharmacy Technician

## 2014-06-03 NOTE — Addendum Note (Signed)
Encounter addended by: Andria Rhein, RN on: 06/03/2014  1:26 PM<BR>     Documentation filed: Charges VN

## 2014-06-04 ENCOUNTER — Other Ambulatory Visit (HOSPITAL_COMMUNITY): Payer: Self-pay | Admitting: *Deleted

## 2014-06-04 NOTE — Patient Instructions (Addendum)
[place]  06/04/2014   Your procedure is scheduled on: Monday November 9th, 2015  Report to [place]  Entrance and follow signs to  [place] at 900 AM.  Call this number if you have problems the morning of surgery [phone]   Remember: [place] FROM DR Alinda Money  Do not eat food or drink liquids :After Midnight.     Take these medicines the morning of surgery with A SIP OF WATER: NONE                               You may not have any metal on your body including hair pins and piercings  Do not wear jewelry,lotions, powders, or deodorant.   Men may shave face and neck.  Do not bring valuables to the hospital. [place].  Contacts, dentures or bridgework may not be worn into surgery.  Leave suitcase in the car. After surgery it may be brought to your room.  For patients admitted to the hospital, checkout time is 11:00 AM the day of discharge.   _______________________________  [facility] - Preparing for Surgery Before surgery, you can play an important role.  Because skin is not sterile, your skin needs to be as free of germs as possible.  You can reduce the number of germs on your skin by washing with CHG (chlorahexidine gluconate) soap before surgery.  CHG is an antiseptic cleaner which kills germs and bonds with the skin to continue killing germs even after washing. Please DO NOT use if you have an allergy to CHG or antibacterial soaps.  If your skin becomes reddened/irritated stop using the CHG and inform your nurse when you arrive at Short Stay. Do not shave (including legs and underarms) for at least 48 hours prior to the first CHG shower.  You may shave your face/neck. Please follow these instructions carefully:  1.  Shower with CHG Soap the night before surgery and the  morning of Surgery.  2.  If you choose to wash your hair, wash your hair first as usual  with your  normal  shampoo.  3.  After you shampoo, rinse your hair and body thoroughly to remove the  shampoo.                           4.  Use CHG as you would any other liquid soap.  You can apply chg directly  to the skin and wash                       Gently with a scrungie or clean washcloth.  5.  Apply the CHG Soap to your body ONLY FROM THE NECK DOWN.   Do not use on face/ open                           Wound or open sores. Avoid contact with eyes, ears mouth and genitals (private parts).                       Wash face,  Genitals (private parts) with your normal soap.             6.  Wash thoroughly, paying special attention to the area  where your surgery  will be performed.  7.  Thoroughly rinse your body with warm water from the neck down.  8.  DO NOT shower/wash with your normal soap after using and rinsing off  the CHG Soap.                9.  Pat yourself dry with a clean towel.            10.  Wear clean pajamas.            11.  Place clean sheets on your bed the night of your first shower and do not  sleep with pets. Day of Surgery : Do not apply any lotions/deodorants the morning of surgery.  Please wear clean clothes to the hospital/surgery center.  FAILURE TO FOLLOW THESE INSTRUCTIONS MAY RESULT IN THE CANCELLATION OF YOUR SURGERY PATIENT SIGNATURE_________________________________  NURSE SIGNATURE__________________________________  ________________________________________________________________________

## 2014-06-07 ENCOUNTER — Encounter (HOSPITAL_COMMUNITY)
Admission: RE | Admit: 2014-06-07 | Discharge: 2014-06-07 | Disposition: A | Discharge status: Home / Self Care | Payer: BC Managed Care – PPO | Source: Ambulatory Visit | Attending: Urology | Admitting: Urology

## 2014-06-07 ENCOUNTER — Ambulatory Visit (HOSPITAL_COMMUNITY)
Admission: RE | Admit: 2014-06-07 | Discharge: 2014-06-07 | Disposition: A | Discharge status: Home / Self Care | Payer: BC Managed Care – PPO | Source: Ambulatory Visit | Attending: Urology | Admitting: Urology

## 2014-06-07 ENCOUNTER — Encounter (HOSPITAL_COMMUNITY): Payer: Self-pay

## 2014-06-07 DIAGNOSIS — C61 Malignant neoplasm of prostate: Secondary | ICD-10-CM | POA: Diagnosis not present

## 2014-06-07 DIAGNOSIS — Z01818 Encounter for other preprocedural examination: Secondary | ICD-10-CM | POA: Insufficient documentation

## 2014-06-07 DIAGNOSIS — I10 Essential (primary) hypertension: Secondary | ICD-10-CM

## 2014-06-07 LAB — BASIC METABOLIC PANEL
ANION GAP: 13 (ref 5–15)
BUN: 11 mg/dL (ref 6–23)
CO2: 26 mEq/L (ref 19–32)
Calcium: 9.6 mg/dL (ref 8.4–10.5)
Chloride: 100 mEq/L (ref 96–112)
Creatinine, Ser: 0.76 mg/dL (ref 0.50–1.35)
GFR calc Af Amer: >90 mL/min (ref >90)
Glucose, Bld: 94 mg/dL (ref 70–99)
POTASSIUM: 4.1 meq/L (ref 3.7–5.3)
SODIUM: 139 meq/L (ref 137–147)

## 2014-06-07 LAB — CBC
HCT: 43.3 % (ref 39.0–52.0)
Hemoglobin: 14.2 g/dL (ref 13.0–17.0)
MCH: 28.9 pg (ref 26.0–34.0)
MCHC: 32.8 g/dL (ref 30.0–36.0)
MCV: 88 fL (ref 78.0–100.0)
PLATELETS: 345 10*3/uL (ref 150–400)
RBC: 4.92 MIL/uL (ref 4.22–5.81)
RDW: 13.6 % (ref 11.5–15.5)
WBC: 9.4 10*3/uL (ref 4.0–10.5)

## 2014-06-07 NOTE — Progress Notes (Signed)
EKG 04/15/2014 per epic  LOV 06/01/2014 Dr Alen Blew

## 2014-06-07 NOTE — Progress Notes (Signed)
Your patient has screened at an elevated risk for Obstructive Sleep Apnea using the Stop-Bang Tool during a pre-surgical vist. A score of 4 or greater is an elevated risk. Score of 4. 

## 2014-06-11 NOTE — H&P (Signed)
Chief Complaint  Prostate Cancer   Reason For Visit  Reason for consult: To discuss treatment options for prostate cancer. Physician requesting consult: Dr. Irine Stuart PCP: Dr. Kathryne Eriksson Location of consult: [place]   History of Present Illness  Thomas Stuart is a 63 year old gentleman with a history of BPH and an elevated PSA who has undergone multiple prior negative biopsies in the past. He underwent a TURP in 2008 also with negative pathology.  He has been treated with finasteride for recurrent LUTS and hematuria due to BPH.  His PSA continued to rise and an MRI was performed in March 2014 which was unremarkable. 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 He was noted to have a very large anterior intravesical component of his BPH. Staging studies were performed on 04/22/14 including a bone scan and CT scan.  Bone scan imaging did demonstrate uptake at L3 and CT findings were consistent with degenerative changes.    ** His medical comorbidities include a history of hypertension and lumbar disc disease.  TNM stage: cT1c N0 M0 PSA: 15.56 (on 5 ARI therapy) Gleason score: 4+4=8 Biopsy (04/15/14): 3/20 cores positive -- Left mid (2/3 cores, 4+3=7, 30% of tissue), L base (< 5%, 4+4=8) Prostate volume: 70 cc  Urinary function: IPSS is 3. Erectile function: SHIM score is 14.  He can achieve erections with Cialis.   Past Medical History  1. History of Disc degeneration, lumbar (M51.36)  2. History of Gross hematuria (R31.0)  3. History of hypertension (Z86.79)  4. History of kidney stones (Z87.442)  5. History of Urinary retention (R33.9)  Surgical History  1. History of Biopsy Of The Prostate Needle  2. History of Biopsy Of The Prostate Needle  3. History of  Cystoscopy With Fulguration  4. History of Hernia Repair  5. History of Hernia Repair  6. History of Lithotripsy  7. History of Transurethral Resection Of Prostate (TURP)   He has undergone bilateral inguinal hernia repairs with mesh on the left side.   Current Meds  1. Aspirin 81 MG Oral Tablet;  Therapy: (Recorded:26Aug2015) to Recorded  2. Benazepril-Hydrochlorothiazide 20-12.5 MG Oral Tablet;  Therapy: (Recorded:10Nov2014) to Recorded  3. Cialis 20 MG Oral Tablet; TAKE 1 TABLET As Directed PRN;  Therapy: 24Oct2008 to (Last Rx:22Apr2013)  Requested for: 22Apr2013 Ordered  4. Diazepam 10 MG Oral Tablet; Take 1 -2 po one hour prior to procedure;  Therapy: 26Aug2015 to (Last Rx:26Aug2015) Ordered  5. Finasteride 5 MG Oral Tablet; TAKE 1 TABLET DAILY AS DIRECTED;  Therapy: 30Apr2014 to (Evaluate:06May2016)  Requested for: [id number]; Last  Rx:12May2015 Ordered  6. Levofloxacin 500 MG Oral Tablet; Take one tablet daily starting day before procedure;  Therapy: 26Aug2015 to (Evaluate:29Aug2015)  Requested for: 26Aug2015; Last  Rx:26Aug2015 Ordered  7. Meloxicam 7.5 MG Oral Tablet;  Therapy: 30Apr2014 to Recorded  8. Multi-Vitamin TABS;  Therapy: (Recorded:25Jun2010) to Recorded  9. Vitamin B-12 TABS;  Therapy: (Recorded:25Jun2010) to Recorded  10. Vitamin C TABS;   Therapy: (Recorded:25Jun2010) to Recorded  11. Vitamin D TABS;   Therapy: (Recorded:22Apr2013) to Recorded  12. Vitamin E TABS;   Therapy: (Recorded:30Apr2014) to Recorded  Allergies  1. No Known Drug Allergies  Family History  1. Family history of malignant neoplasm of male breast (Z80.3) : Mother  2. Family history of senile dementia (Z82.0) : Mother  3. Denied: Family history of Prostate Cancer  Social History   Denied: Alcohol Use   Marital History - Single   Never A Smoker   Occupation:   Retired From Work  Review of Systems AU Complete-Male: Genitourinary, constitutional, skin, eye, otolaryngeal,  hematologic/lymphatic, cardiovascular, pulmonary, endocrine, musculoskeletal, gastrointestinal, neurological and psychiatric system(s) were reviewed and pertinent findings if present are noted.    Physical Exam Constitutional: Well nourished and well developed . No acute distress.  ENT:. The ears and nose are normal in appearance.  Neck: The appearance of the neck is normal and no neck mass is present.  Pulmonary: No respiratory distress, normal respiratory rhythm and effort and clear bilateral breath sounds.  Cardiovascular: Heart rate and rhythm are normal . No peripheral edema.  Abdomen: The abdomen is rounded.  Rectal: Prostate size is estimated to be 55 g. The prostate has no nodularity and is not indurated.  Lymphatics: The femoral and inguinal nodes are not enlarged or tender.  Skin: Normal skin turgor, no visible rash and no visible skin lesions.  Neuro/Psych:. Mood and affect are appropriate.    Results/Data Selected Results  AU CT-HEMATURIA PROTOCOL 02Sep2015 12:00AM Thomas Stuart   Test Name Result Flag Reference  AU CT-HEMATURIA PROTOCOL (Report)    ** RADIOLOGY REPORT BY [facility] **   CLINICAL DATA: Gross hematuria. Urolithiasis. Back pain.  EXAM: CT ABDOMEN AND PELVIS WITHOUT AND WITH CONTRAST  TECHNIQUE: Multidetector CT imaging of the abdomen and pelvis was performed following the standard protocol before and following the bolus administration of intravenous contrast.  CONTRAST: 125 mL Isovue 300  COMPARISON: Prostate MRI on 06/1913 and AP CT on 12/24/2008  FINDINGS: Kidneys/Urinary Tract: No evidence of urolithiasis or hydronephrosis. No evidence of complex cystic or solid renal masses. Probable tiny sub-cm cyst noted in the lower pole the left kidney which is too small to characterize. No masses or filling defects are seen involving the renal collecting systems or lower urinary tract. Marked median lobe hypertrophy of the prostate gland is  seen with indentation of the urinary bladder, which is stable. No intrinsic bladder mass visualized.  Adrenal Glands: No mass identified.  Liver: Tiny left hepatic lobe cyst remains stable. No liver masses are identified.  Gallbladder/Biliary: Unremarkable.  Pancreas: No mass, inflammatory changes, or other parenchymal abnormality identified.  Spleen: Within normal limits in size and appearance.  Lymph Nodes: No pathologically enlarged lymph nodes identified.  Bowel/Peritoneum: No evidence of bowel wall thickening, mass, or obstruction.  Pelvic/Reproductive Organs: No mass or other significant abnormality identified.  Vascular: No evidence of abdominal aortic aneurysm.  Musculoskeletal: No suspicious bone lesions identified.  Other: Small bilateral inguinal hernias containing only fat remains stable.  IMPRESSION: No radiographic evidence of urinary tract carcinoma, urolithiasis, or hydronephrosis.  Stable marked median lobe hypertrophy of the prostate, with indentation of urinary bladder.   Electronically Signed  By: Thomas Stuart M.D.  On: 04/07/2014 10:20     I have reviewed his MRI from 2014, recent CT scan, bone scan, PSA results, pathology report and slides and reviewed all of these pieces of information independently today.  Assessment  1. Prostate cancer (C61)  Plan  1. PT/OT Referral Referral  Referral  Status: Hold For - Appointment,PreCert,Date of  Service,Physical Therapy  Requested for: 27Oct2015  Discussion/Summary  1.  Prostate cancer: Mr. Strawderman feels very well informed after his discussions today and has met with both Dr. Alen Blew and  Dr. Valere Dross prior to his meeting with me today.  He does wish to proceed with surgical treatment.  Considering his significant intravesical component of his prostate, this would appear to likely be his most prudent option for therapy.   The patient was counseled about the natural history of prostate cancer and the  standard treatment options that are available for prostate cancer. It was explained to him how his age and life expectancy, clinical stage, Gleason score, and PSA affect his prognosis, the decision to proceed with additional staging studies, as well as how that information influences recommended treatment strategies. We discussed the roles for active surveillance, radiation therapy, surgical therapy, androgen deprivation, as well as ablative therapy options for the treatment of prostate cancer as appropriate to his individual cancer situation. We discussed the risks and benefits of these options with regard to their impact on cancer control and also in terms of potential adverse events, complications, and impact on quiality of life particularly related to urinary, bowel, and sexual function. The patient was encouraged to ask questions throughout the discussion today and all questions were answered to his stated satisfaction. In addition, the patient was provided with and/or directed to appropriate resources and literature for further education about prostate cancer and treatment options.   We discussed surgical therapy for prostate cancer including the different available surgical approaches. We discussed, in detail, the risks and expectations of surgery with regard to cancer control, urinary control, and erectile function as well as the expected postoperative recovery process. Additional risks of surgery including but not limited to bleeding, infection, hernia formation, nerve damage, lymphocele formation, bowel/rectal injury potentially necessitating colostomy, damage to the urinary tract resulting in urine leakage, urethral stricture, and the cardiopulmonary risks such as myocardial infarction, stroke, death, venothromboembolism, etc. were explained. The risk of open surgical conversion for robotic/laparoscopic prostatectomy was also discussed.   He understands that he will have a high risk for erectile  dysfunction and accepts this risk.  He understands the surgery will likely be more complex and require a longer procedure with bladder neck reconstruction.  He also understands that he possibly has an increased risk of open surgical conversion.  He does wish to proceed and will be scheduled for a bilateral nerve sparing robotic-assisted laparoscopic radical prostatectomy and bilateral pelvic lymphadenectomy.  Cc: Dr. Irine Stuart Dr. Kathryne Eriksson Dr. Arloa Koh Dr. Zola Button  A total of 60 minutes were spent in the overall care of the patient today with 45 minutes in direct face to face consultation.    Signatures Electronically signed by : Thomas Stuart, M.D.; Jun 01 2014  5:29PM EST

## 2014-06-14 ENCOUNTER — Ambulatory Visit (HOSPITAL_COMMUNITY): Payer: BC Managed Care – PPO | Admitting: Anesthesiology

## 2014-06-14 ENCOUNTER — Inpatient Hospital Stay (HOSPITAL_COMMUNITY)
Admission: RE | Admit: 2014-06-14 | Discharge: 2014-06-15 | DRG: 708 | Disposition: A | Discharge status: Home / Self Care | Payer: BC Managed Care – PPO | Source: Ambulatory Visit | Attending: Urology | Admitting: Urology

## 2014-06-14 ENCOUNTER — Encounter (HOSPITAL_COMMUNITY)
Admission: RE | Disposition: A | Discharge status: Home / Self Care | Payer: Self-pay | Source: Ambulatory Visit | Attending: Urology

## 2014-06-14 ENCOUNTER — Encounter (HOSPITAL_COMMUNITY): Payer: Self-pay | Admitting: *Deleted

## 2014-06-14 DIAGNOSIS — Z8546 Personal history of malignant neoplasm of prostate: Secondary | ICD-10-CM | POA: Diagnosis present

## 2014-06-14 DIAGNOSIS — C61 Malignant neoplasm of prostate: Secondary | ICD-10-CM | POA: Diagnosis present

## 2014-06-14 DIAGNOSIS — Z9079 Acquired absence of other genital organ(s): Secondary | ICD-10-CM | POA: Diagnosis present

## 2014-06-14 DIAGNOSIS — I10 Essential (primary) hypertension: Secondary | ICD-10-CM | POA: Diagnosis present

## 2014-06-14 DIAGNOSIS — Z7982 Long term (current) use of aspirin: Secondary | ICD-10-CM

## 2014-06-14 DIAGNOSIS — Z87442 Personal history of urinary calculi: Secondary | ICD-10-CM | POA: Diagnosis not present

## 2014-06-14 HISTORY — PX: ROBOT ASSISTED LAPAROSCOPIC RADICAL PROSTATECTOMY: SHX5141

## 2014-06-14 HISTORY — PX: LYMPHADENECTOMY: SHX5960

## 2014-06-14 LAB — HEMOGLOBIN AND HEMATOCRIT, BLOOD
HEMATOCRIT: 40.6 % (ref 39.0–52.0)
Hemoglobin: 13.3 g/dL (ref 13.0–17.0)

## 2014-06-14 LAB — TYPE AND SCREEN
ABO/RH(D): A POS
Antibody Screen: NEGATIVE

## 2014-06-14 LAB — ABO/RH: ABO/RH(D): A POS

## 2014-06-14 SURGERY — ROBOTIC ASSISTED LAPAROSCOPIC RADICAL PROSTATECTOMY LEVEL 3
Anesthesia: General

## 2014-06-14 MED ORDER — HYDROMORPHONE HCL 1 MG/ML IJ SOLN
INTRAMUSCULAR | Status: DC | PRN
  Administered 2014-06-14 (×2): 1 mg via INTRAVENOUS

## 2014-06-14 MED ORDER — HEPARIN SODIUM (PORCINE) 1000 UNIT/ML IJ SOLN
INTRAMUSCULAR | Status: DC | PRN
  Administered 2014-06-14: 13:00:00

## 2014-06-14 MED ORDER — PROPOFOL 10 MG/ML IV BOLUS
INTRAVENOUS | Status: DC | PRN
  Administered 2014-06-14: 5 mg via INTRAVENOUS

## 2014-06-14 MED ORDER — HYDRALAZINE HCL 20 MG/ML IJ SOLN
INTRAMUSCULAR | Status: AC
Start: 2014-06-14 — End: 2014-06-14
  Filled 2014-06-14: qty 1

## 2014-06-14 MED ORDER — SUCCINYLCHOLINE CHLORIDE 20 MG/ML IJ SOLN
INTRAMUSCULAR | Status: DC | PRN
Start: 2014-06-14 — End: 2014-06-14
  Administered 2014-06-14: 100 mg via INTRAVENOUS

## 2014-06-14 MED ORDER — ACETAMINOPHEN 325 MG PO TABS: 650.0000 mg | ORAL_TABLET | ORAL | Status: DC | PRN

## 2014-06-14 MED ORDER — HYDROMORPHONE HCL 1 MG/ML IJ SOLN
0.2500 mg | INTRAMUSCULAR | Status: DC | PRN
Start: 2014-06-14 — End: 2014-06-14

## 2014-06-14 MED ORDER — LIDOCAINE HCL (CARDIAC) 20 MG/ML IV SOLN
INTRAVENOUS | Status: DC | PRN
  Administered 2014-06-14: 50 mg via INTRAVENOUS

## 2014-06-14 MED ORDER — CEFAZOLIN SODIUM 1-5 GM-% IV SOLN
1.0000 g | Freq: Three times a day (TID) | INTRAVENOUS | Status: AC
  Administered 2014-06-14 – 2014-06-15 (×2): 1 g via INTRAVENOUS
  Filled 2014-06-14 (×2): qty 50

## 2014-06-14 MED ORDER — SODIUM CHLORIDE 0.9 % IV BOLUS (SEPSIS)
1000.0000 mL | Freq: Once | INTRAVENOUS | Status: AC
  Administered 2014-06-14: 1000 mL via INTRAVENOUS

## 2014-06-14 MED ORDER — HYDRALAZINE HCL 20 MG/ML IJ SOLN
INTRAMUSCULAR | Status: DC | PRN
  Administered 2014-06-14: 5 mg via INTRAVENOUS

## 2014-06-14 MED ORDER — FENTANYL CITRATE 0.05 MG/ML IJ SOLN
INTRAMUSCULAR | Status: DC | PRN
  Administered 2014-06-14: 50 ug via INTRAVENOUS
  Administered 2014-06-14: 100 ug via INTRAVENOUS
  Administered 2014-06-14 (×2): 50 ug via INTRAVENOUS

## 2014-06-14 MED ORDER — DIPHENHYDRAMINE HCL 12.5 MG/5ML PO ELIX: 12.5000 mg | ORAL_SOLUTION | Freq: Four times a day (QID) | ORAL | Status: DC | PRN

## 2014-06-14 MED ORDER — CIPROFLOXACIN HCL 500 MG PO TABS: 500.0000 mg | ORAL_TABLET | Freq: Two times a day (BID) | ORAL | Status: DC

## 2014-06-14 MED ORDER — BUPIVACAINE-EPINEPHRINE 0.25% -1:200000 IJ SOLN
INTRAMUSCULAR | Status: DC | PRN
  Administered 2014-06-14: 30 mL

## 2014-06-14 MED ORDER — NEOSTIGMINE METHYLSULFATE 10 MG/10ML IV SOLN
INTRAVENOUS | Status: AC
  Filled 2014-06-14: qty 1

## 2014-06-14 MED ORDER — DOCUSATE SODIUM 100 MG PO CAPS
100.0000 mg | ORAL_CAPSULE | Freq: Two times a day (BID) | ORAL | Status: DC
  Administered 2014-06-15 (×2): 100 mg via ORAL
  Filled 2014-06-14 (×3): qty 1

## 2014-06-14 MED ORDER — MIDAZOLAM HCL 5 MG/5ML IJ SOLN
INTRAMUSCULAR | Status: DC | PRN
  Administered 2014-06-14: 2 mg via INTRAVENOUS

## 2014-06-14 MED ORDER — CEFAZOLIN SODIUM-DEXTROSE 2-3 GM-% IV SOLR
INTRAVENOUS | Status: AC
  Filled 2014-06-14: qty 50

## 2014-06-14 MED ORDER — MORPHINE SULFATE 2 MG/ML IJ SOLN: 2.0000 mg | INTRAMUSCULAR | Status: DC | PRN

## 2014-06-14 MED ORDER — ONDANSETRON HCL 4 MG/2ML IJ SOLN
4.0000 mg | INTRAMUSCULAR | Status: DC | PRN
  Administered 2014-06-14: 4 mg via INTRAVENOUS
  Filled 2014-06-14: qty 2

## 2014-06-14 MED ORDER — FENTANYL CITRATE 0.05 MG/ML IJ SOLN
INTRAMUSCULAR | Status: AC
  Filled 2014-06-14: qty 5

## 2014-06-14 MED ORDER — SODIUM CHLORIDE 0.9 % IR SOLN
Status: DC | PRN
  Administered 2014-06-14: 1000 mL

## 2014-06-14 MED ORDER — HYDROMORPHONE HCL 2 MG/ML IJ SOLN
INTRAMUSCULAR | Status: AC
  Filled 2014-06-14: qty 1

## 2014-06-14 MED ORDER — GLYCOPYRROLATE 0.2 MG/ML IJ SOLN
INTRAMUSCULAR | Status: AC
  Filled 2014-06-14: qty 3

## 2014-06-14 MED ORDER — ROCURONIUM BROMIDE 100 MG/10ML IV SOLN
INTRAVENOUS | Status: AC
  Filled 2014-06-14: qty 1

## 2014-06-14 MED ORDER — HEPARIN SODIUM (PORCINE) 1000 UNIT/ML IJ SOLN
INTRAMUSCULAR | Status: AC
  Filled 2014-06-14: qty 1

## 2014-06-14 MED ORDER — LIDOCAINE HCL (CARDIAC) 20 MG/ML IV SOLN
INTRAVENOUS | Status: AC
  Filled 2014-06-14: qty 5

## 2014-06-14 MED ORDER — LACTATED RINGERS IV SOLN
INTRAVENOUS | Status: DC
  Administered 2014-06-14 (×2): via INTRAVENOUS
  Administered 2014-06-14: 1000 mL via INTRAVENOUS

## 2014-06-14 MED ORDER — MENTHOL 3 MG MT LOZG
1.0000 | LOZENGE | OROMUCOSAL | Status: DC | PRN
  Filled 2014-06-14: qty 9

## 2014-06-14 MED ORDER — KCL IN DEXTROSE-NACL 20-5-0.45 MEQ/L-%-% IV SOLN
INTRAVENOUS | Status: DC
  Administered 2014-06-14 – 2014-06-15 (×2): via INTRAVENOUS
  Filled 2014-06-14 (×4): qty 1000

## 2014-06-14 MED ORDER — GLYCOPYRROLATE 0.2 MG/ML IJ SOLN
INTRAMUSCULAR | Status: DC | PRN
  Administered 2014-06-14: 0.6 mg via INTRAVENOUS

## 2014-06-14 MED ORDER — PROMETHAZINE HCL 25 MG/ML IJ SOLN: 6.2500 mg | INTRAMUSCULAR | Status: DC | PRN

## 2014-06-14 MED ORDER — HYDROCODONE-ACETAMINOPHEN 5-325 MG PO TABS: 1.0000 | ORAL_TABLET | Freq: Four times a day (QID) | ORAL | Status: DC | PRN

## 2014-06-14 MED ORDER — ONDANSETRON HCL 4 MG/2ML IJ SOLN
INTRAMUSCULAR | Status: AC
  Filled 2014-06-14: qty 2

## 2014-06-14 MED ORDER — PROPOFOL 10 MG/ML IV BOLUS
INTRAVENOUS | Status: AC
Start: 2014-06-14 — End: 2014-06-14
  Filled 2014-06-14: qty 20

## 2014-06-14 MED ORDER — KETOROLAC TROMETHAMINE 15 MG/ML IJ SOLN
15.0000 mg | Freq: Four times a day (QID) | INTRAMUSCULAR | Status: DC
  Administered 2014-06-14 – 2014-06-15 (×3): 15 mg via INTRAVENOUS
  Filled 2014-06-14 (×5): qty 1

## 2014-06-14 MED ORDER — CEFAZOLIN SODIUM-DEXTROSE 2-3 GM-% IV SOLR
2.0000 g | INTRAVENOUS | Status: AC
  Administered 2014-06-14: 2 g via INTRAVENOUS

## 2014-06-14 MED ORDER — DEXAMETHASONE SODIUM PHOSPHATE 10 MG/ML IJ SOLN
INTRAMUSCULAR | Status: DC | PRN
  Administered 2014-06-14: 10 mg via INTRAVENOUS

## 2014-06-14 MED ORDER — STERILE WATER FOR IRRIGATION IR SOLN
Status: DC | PRN
  Administered 2014-06-14: 3000 mL

## 2014-06-14 MED ORDER — DIPHENHYDRAMINE HCL 50 MG/ML IJ SOLN: 12.5000 mg | Freq: Four times a day (QID) | INTRAMUSCULAR | Status: DC | PRN

## 2014-06-14 MED ORDER — BUPIVACAINE-EPINEPHRINE (PF) 0.25% -1:200000 IJ SOLN
INTRAMUSCULAR | Status: AC
  Filled 2014-06-14: qty 30

## 2014-06-14 MED ORDER — ROCURONIUM BROMIDE 100 MG/10ML IV SOLN
INTRAVENOUS | Status: DC | PRN
  Administered 2014-06-14: 45 mg via INTRAVENOUS
  Administered 2014-06-14 (×2): 10 mg via INTRAVENOUS

## 2014-06-14 MED ORDER — DEXAMETHASONE SODIUM PHOSPHATE 10 MG/ML IJ SOLN
INTRAMUSCULAR | Status: AC
  Filled 2014-06-14: qty 1

## 2014-06-14 MED ORDER — MIDAZOLAM HCL 2 MG/2ML IJ SOLN
INTRAMUSCULAR | Status: AC
  Filled 2014-06-14: qty 2

## 2014-06-14 MED ORDER — NEOSTIGMINE METHYLSULFATE 10 MG/10ML IV SOLN
INTRAVENOUS | Status: DC | PRN
Start: 2014-06-14 — End: 2014-06-14
  Administered 2014-06-14: 4 mg via INTRAVENOUS

## 2014-06-14 SURGICAL SUPPLY — 47 items
CABLE HIGH FREQUENCY MONO STRZ (ELECTRODE) ×3 IMPLANT
CANISTER SUCTION 2500CC (MISCELLANEOUS) IMPLANT
CATH FOLEY 2WAY SLVR 18FR 30CC (CATHETERS) ×3 IMPLANT
CATH ROBINSON RED A/P 16FR (CATHETERS) ×3 IMPLANT
CATH ROBINSON RED A/P 8FR (CATHETERS) ×3 IMPLANT
CATH TIEMANN FOLEY 18FR 5CC (CATHETERS) ×3 IMPLANT
CHLORAPREP W/TINT 26ML (MISCELLANEOUS) ×3 IMPLANT
CLIP LIGATING HEM O LOK PURPLE (MISCELLANEOUS) ×6 IMPLANT
CLOTH BEACON ORANGE TIMEOUT ST (SAFETY) ×3 IMPLANT
COVER SURGICAL LIGHT HANDLE (MISCELLANEOUS) ×3 IMPLANT
COVER TIP SHEARS 8 DVNC (MISCELLANEOUS) ×2 IMPLANT
COVER TIP SHEARS 8MM DA VINCI (MISCELLANEOUS) ×1
CUTTER ECHEON FLEX ENDO 45 340 (ENDOMECHANICALS) ×3 IMPLANT
DECANTER SPIKE VIAL GLASS SM (MISCELLANEOUS) IMPLANT
DRAPE SURG IRRIG POUCH 19X23 (DRAPES) ×3 IMPLANT
DRSG TEGADERM 4X4.75 (GAUZE/BANDAGES/DRESSINGS) ×3 IMPLANT
DRSG TEGADERM 6X8 (GAUZE/BANDAGES/DRESSINGS) ×6 IMPLANT
ELECT REM PT RETURN 9FT ADLT (ELECTROSURGICAL) ×3
ELECTRODE REM PT RTRN 9FT ADLT (ELECTROSURGICAL) ×2 IMPLANT
GAUZE SPONGE 2X2 8PLY STRL LF (GAUZE/BANDAGES/DRESSINGS) ×2 IMPLANT
GLOVE BIO SURGEON STRL SZ 6.5 (GLOVE) ×6 IMPLANT
GLOVE BIOGEL M STRL SZ7.5 (GLOVE) ×6 IMPLANT
GOWN STRL REUS W/TWL LRG LVL3 (GOWN DISPOSABLE) ×21 IMPLANT
HOLDER FOLEY CATH W/STRAP (MISCELLANEOUS) ×3 IMPLANT
IV LACTATED RINGERS 1000ML (IV SOLUTION) IMPLANT
KIT ACCESSORY DA VINCI DISP (KITS) ×1
KIT ACCESSORY DVNC DISP (KITS) ×2 IMPLANT
LIQUID BAND (GAUZE/BANDAGES/DRESSINGS) ×6 IMPLANT
MANIFOLD NEPTUNE II (INSTRUMENTS) ×3 IMPLANT
NDL SAFETY ECLIPSE 18X1.5 (NEEDLE) ×2 IMPLANT
NEEDLE HYPO 18GX1.5 SHARP (NEEDLE) ×1
PACK ROBOT UROLOGY CUSTOM (CUSTOM PROCEDURE TRAY) ×3 IMPLANT
RELOAD GREEN ECHELON 45 (STAPLE) ×3 IMPLANT
SET TUBE IRRIG SUCTION NO TIP (IRRIGATION / IRRIGATOR) ×3 IMPLANT
SHEET LAVH (DRAPES) ×3 IMPLANT
SLEEVE SURGEON STRL (DRAPES) ×3 IMPLANT
SOLUTION ELECTROLUBE (MISCELLANEOUS) ×3 IMPLANT
SPONGE GAUZE 2X2 STER 10/PKG (GAUZE/BANDAGES/DRESSINGS) ×1
SUT ETHILON 3 0 PS 1 (SUTURE) ×3 IMPLANT
SUT MNCRL AB 4-0 PS2 18 (SUTURE) ×6 IMPLANT
SUT VIC AB 3-0 SH 27 (SUTURE) ×2
SUT VIC AB 3-0 SH 27X BRD (SUTURE) ×4 IMPLANT
SUT VICRYL 0 UR6 27IN ABS (SUTURE) ×6 IMPLANT
SYR 27GX1/2 1ML LL SAFETY (SYRINGE) ×3 IMPLANT
TOWEL OR 17X26 10 PK STRL BLUE (TOWEL DISPOSABLE) ×3 IMPLANT
TOWEL OR NON WOVEN STRL DISP B (DISPOSABLE) ×3 IMPLANT
WATER STERILE IRR 1500ML POUR (IV SOLUTION) IMPLANT

## 2014-06-14 NOTE — Discharge Instructions (Signed)

## 2014-06-14 NOTE — Transfer of Care (Signed)
Immediate Anesthesia Transfer of Care Note  Patient: Thomas Stuart [facility]  Procedure(s) Performed: Procedure(s): ROBOTIC ASSISTED LAPAROSCOPIC RADICAL PROSTATECTOMY LEVEL 3 (N/A) LYMPHADENECTOMY (Bilateral)  Patient Location: PACU  Anesthesia Type:General  Level of Consciousness: awake, alert  and oriented  Airway & Oxygen Therapy: Patient Spontanous Breathing and Patient connected to face mask oxygen  Post-op Assessment: Report given to PACU RN and Post -op Vital signs reviewed and stable  Post vital signs: Reviewed and stable  Complications: No apparent anesthesia complications

## 2014-06-14 NOTE — Interval H&P Note (Signed)
History and Physical Interval Note:  06/14/2014 10:13 AM  Thomas Stuart  has presented today for surgery, with the diagnosis of prostate cancer  The various methods of treatment have been discussed with the patient and family. After consideration of risks, benefits and other options for treatment, the patient has consented to  Procedure(s): ROBOTIC ASSISTED LAPAROSCOPIC RADICAL PROSTATECTOMY LEVEL 3 (N/A) LYMPHADENECTOMY (Bilateral) as a surgical intervention .  The patient's history has been reviewed, patient examined, no change in status, stable for surgery.  I have reviewed the patient's chart and labs.  Questions were answered to the patient's satisfaction.     Yohan Samons,LES

## 2014-06-14 NOTE — Anesthesia Preprocedure Evaluation (Signed)
Anesthesia Evaluation  Patient identified by MRN, date of birth, ID band Patient awake    Reviewed: Allergy & Precautions, H&P , NPO status , Patient's Chart, lab work & pertinent test results  Airway Mallampati: II  TM Distance: >3 FB Neck ROM: Full    Dental no notable dental hx.    Pulmonary neg pulmonary ROS,  breath sounds clear to auscultation  Pulmonary exam normal       Cardiovascular hypertension, Pt. on medications Rhythm:Regular Rate:Normal     Neuro/Psych negative neurological ROS  negative psych ROS   GI/Hepatic negative GI ROS, Neg liver ROS,   Endo/Other  negative endocrine ROS  Renal/GU Renal disease  negative genitourinary   Musculoskeletal  (+) Arthritis -,   Abdominal (+) + obese,   Peds negative pediatric ROS (+)  Hematology negative hematology ROS (+)   Anesthesia Other Findings   Reproductive/Obstetrics negative OB ROS                             Anesthesia Physical Anesthesia Plan  ASA: II  Anesthesia Plan: General   Post-op Pain Management:    Induction: Intravenous  Airway Management Planned: Oral ETT  Additional Equipment:   Intra-op Plan:   Post-operative Plan: Extubation in OR  Informed Consent: I have reviewed the patients History and Physical, chart, labs and discussed the procedure including the risks, benefits and alternatives for the proposed anesthesia with the patient or authorized representative who has indicated his/her understanding and acceptance.   Dental advisory given  Plan Discussed with: CRNA  Anesthesia Plan Comments:         Anesthesia Quick Evaluation

## 2014-06-14 NOTE — Op Note (Signed)
Preoperative diagnosis: Clinically localized adenocarcinoma of the prostate (clinical stage T1c N0 Mx)  Postoperative diagnosis: Clinically localized adenocarcinoma of the prostate (clinical stage T1c N0 Mx)  Procedure:  1. Robotic assisted laparoscopic radical prostatectomy (bilateral nerve sparing) 2. Bilateral robotic assisted laparoscopic pelvic lymphadenectomy  Surgeon: Pryor Curia. M.D.  Assistant: Debbrah Alar, PA-C  Anesthesia: General  Complications: None  EBL: 100 mL  IVF:  1900 mL crystalloid  Specimens: 1. Prostate and seminal vesicles 2. Right pelvic lymph nodes 3. Left pelvic lymph nodes  Disposition of specimens: Pathology  Drains: 1. 20 Fr coude catheter 2. # 19 Blake pelvic drain  Indication: Thomas Stuart is a 63 y.o. year old patient with clinically localized prostate cancer.  After a thorough review of the management options for treatment of prostate cancer, he elected to proceed with surgical therapy and the above procedure(s).  We have discussed the potential benefits and risks of the procedure, side effects of the proposed treatment, the likelihood of the patient achieving the goals of the procedure, and any potential problems that might occur during the procedure or recuperation. Informed consent has been obtained.  Description of procedure:  The patient was taken to the operating room and a general anesthetic was administered. He was given preoperative antibiotics, placed in the dorsal lithotomy position, and prepped and draped in the usual sterile fashion. Next a preoperative timeout was performed. A urethral catheter was placed into the bladder and a site was selected near the umbilicus for placement of the camera port. This was placed using a standard open Hassan technique which allowed entry into the peritoneal cavity under direct vision and without difficulty. A 12 mm port was placed and a pneumoperitoneum established. The camera was then  used to inspect the abdomen and there was no evidence of any intra-abdominal injuries or other abnormalities. The remaining abdominal ports were then placed. 8 mm robotic ports were placed in the right lower quadrant, left lower quadrant, and far left lateral abdominal wall. A 5 mm port was placed in the right upper quadrant and a 12 mm port was placed in the right lateral abdominal wall for laparoscopic assistance. All ports were placed under direct vision without difficulty. The surgical cart was then docked.   Utilizing the cautery scissors, the bladder was reflected posteriorly allowing entry into the space of Retzius and identification of the endopelvic fascia and prostate. The periprostatic fat was then removed from the prostate allowing full exposure of the endopelvic fascia. The endopelvic fascia was then incised from the apex back to the base of the prostate bilaterally and the underlying levator muscle fibers were swept laterally off the prostate thereby isolating the dorsal venous complex. The dorsal vein was then stapled and divided with a 45 mm Flex Echelon stapler. Attention then turned to the bladder neck which was divided anteriorly thereby allowing entry into the bladder and exposure of the urethral catheter. The catheter balloon was deflated and the catheter was brought into the operative field and used to retract the prostate anteriorly. There was a very large intravesical lobe off the anterior prostate that extending far into the bladder.  This intravesical lobe was carefully dissected away and lifted out of the bladder without having to increased the size of the bladder neck too much. The posterior bladder neck was then examined, the prior TUR defect was noted, and the bladder neck was divided allowing further dissection between the bladder and prostate posteriorly until the vasa deferentia and seminal  vesicles were identified. The vasa deferentia were isolated, divided, and lifted anteriorly.  The seminal vesicles were dissected down to their tips with care to control the seminal vascular arterial blood supply. These structures were then lifted anteriorly and the space between Denonvillier's fascia and the anterior rectum was developed with a combination of sharp and blunt dissection. This isolated the vascular pedicles of the prostate.  The lateral prostatic fascia was then sharply incised allowing release of the neurovascular bundles bilaterally. The vascular pedicles of the prostate were then ligated with Weck clips between the prostate and neurovascular bundles and divided with sharp cold scissor dissection resulting in neurovascular bundle preservation. The neurovascular bundles were then separated off the apex of the prostate and urethra bilaterally.  The urethra was then sharply transected allowing the prostate specimen to be disarticulated. The pelvis was copiously irrigated and hemostasis was ensured. There was no evidence for rectal injury.  Attention then turned to the right pelvic sidewall. The fibrofatty tissue between the external iliac vein, confluence of the iliac vessels, hypogastric artery, and Cooper's ligament was dissected free from the pelvic sidewall with care to preserve the obturator nerve. Weck clips were used for lymphostasis and hemostasis. An identical procedure was performed on the contralateral side and the lymphatic packets were removed for permanent pathologic analysis.  Attention then turned to the urethral anastomosis. A 2-0 Vicryl slip knot was placed between Denonvillier's fascia, the posterior bladder neck, and the posterior urethra to reapproximate these structures. A double-armed 3-0 Monocryl suture was then used to perform a 360 running tension-free anastomosis between the bladder neck and urethra. A new urethral catheter was then placed into the bladder and irrigated. There were no blood clots within the bladder and the anastomosis appeared to be  watertight. A #19 Blake drain was then brought through the left lateral 8 mm port site and positioned appropriately within the pelvis. It was secured to the skin with a nylon suture. The surgical cart was then undocked. The right lateral 12 mm port site was closed at the fascial level with a 0 Vicryl suture placed laparoscopically. All remaining ports were then removed under direct vision. The prostate specimen was removed intact within the Endopouch retrieval bag via the periumbilical camera port site. This fascial opening was closed with two running 0 Vicryl sutures. 0.25% Marcaine was then injected into all port sites and all incisions were reapproximated at the skin level with 4-0 Monocryl subcuticular sutures and Dermabond. The patient appeared to tolerate the procedure well and without complications. The patient was able to be extubated and transferred to the recovery unit in satisfactory condition.   Pryor Curia MD

## 2014-06-14 NOTE — Progress Notes (Signed)
Patient ID: Thomas Stuart, male   DOB: 1951-07-29, 63 y.o.   MRN: [id number]  Post-op note  Subjective: The patient is doing well.  No complaints.  Objective: Vital signs in last 24 hours: Temp:  [97.5 F (36.4 C)-98.1 F (36.7 C)] 97.5 F (36.4 C) (11/09 1615) Pulse Rate:  [72-104] 96 (11/09 1615) Resp:  [15-18] 16 (11/09 1615) BP: (128-162)/(68-84) 128/68 mmHg (11/09 1615) SpO2:  [97 %-100 %] 100 % (11/09 1615) Weight:  [101.152 kg (223 lb)] 101.152 kg (223 lb) (11/09 0857)  Intake/Output from previous day:   Intake/Output this shift: Total I/O In: 3100 [I.V.:2100; IV Piggyback:1000] Out: 535 [Urine:375; Drains:60; Blood:100]  Physical Exam:  General: Alert and oriented. Abdomen: Soft, Nondistended. Incisions: Clean and dry. GU: Urine clear  Lab Results:  Recent Labs  06/14/14 1511  HGB 13.3  HCT 40.6    Assessment/Plan: POD#0   1) Continue to monitor, ambulate   Thomas Stuart. MD   LOS: 0 days   Thomas Stuart,LES 06/14/2014, 6:43 PM

## 2014-06-14 NOTE — Plan of Care (Signed)
Problem: Phase I Progression Outcomes Goal: Pain controlled with appropriate interventions Outcome: Completed/Met Date Met:  06/14/14 Goal: Foley/JP patent Outcome: Completed/Met Date Met:  06/14/14 Goal: Incision/dressing intact Outcome: Completed/Met Date Met:  06/14/14

## 2014-06-15 ENCOUNTER — Encounter (HOSPITAL_COMMUNITY): Payer: Self-pay | Admitting: Urology

## 2014-06-15 LAB — HEMOGLOBIN AND HEMATOCRIT, BLOOD
HCT: 36.3 % — ABNORMAL LOW (ref 39.0–52.0)
Hemoglobin: 11.8 g/dL — ABNORMAL LOW (ref 13.0–17.0)

## 2014-06-15 MED ORDER — BISACODYL 10 MG RE SUPP
10.0000 mg | Freq: Once | RECTAL | Status: AC
  Administered 2014-06-15: 10 mg via RECTAL
  Filled 2014-06-15: qty 1

## 2014-06-15 MED ORDER — HYDROCODONE-ACETAMINOPHEN 5-325 MG PO TABS: 1.0000 | ORAL_TABLET | Freq: Four times a day (QID) | ORAL | Status: DC | PRN

## 2014-06-15 NOTE — Progress Notes (Signed)
Patient ID: Thomas Stuart, male   DOB: 1950/09/04, 63 y.o.   MRN: [id number]  1 Day Post-Op Subjective: The patient is doing well.  No nausea or vomiting. Pain is adequately controlled.  Objective: Vital signs in last 24 hours: Temp:  [97.5 F (36.4 C)-98.9 F (37.2 C)] 98.5 F (36.9 C) (11/10 0621) Pulse Rate:  [72-104] 89 (11/10 0621) Resp:  [15-18] 18 (11/10 0621) BP: (126-162)/(65-84) 126/65 mmHg (11/10 0621) SpO2:  [96 %-100 %] 99 % (11/10 0621) Weight:  [101.152 kg (223 lb)] 101.152 kg (223 lb) (11/09 0857)  Intake/Output from previous day: 11/09 0701 - 11/10 0700 In: 5090 [I.V.:4040; IV Piggyback:1050] Out: 3220 [Urine:2975; Drains:145; Blood:100] Intake/Output this shift:    Physical Exam:  General: Alert and oriented. CV: RRR Lungs: Clear bilaterally. GI: Soft, Nondistended. Incisions: Clean, dry, and intact Urine: Clear Extremities: Nontender, no erythema, no edema.  Lab Results:  Recent Labs  06/14/14 1511 06/15/14 0426  HGB 13.3 11.8*  HCT 40.6 36.3*      Assessment/Plan: POD# 1 s/p robotic prostatectomy.  1) SL IVF 2) Ambulate, Incentive spirometry 3) Transition to oral pain medication 4) Dulcolax suppository 5) D/C pelvic drain 6) Plan for likely discharge later today   Thomas Stuart. MD   LOS: 1 day   Thomas Stuart,LES 06/15/2014, 8:07 AM

## 2014-06-15 NOTE — Discharge Summary (Signed)
  Date of admission: 06/14/2014  Date of discharge: 06/15/2014  Admission diagnosis: Prostate Cancer  Discharge diagnosis: Prostate Cancer  History and Physical: For full details, please see admission history and physical. Briefly, Thomas Stuart is a 63 y.o. gentleman with localized prostate cancer.  After discussing management/treatment options, he elected to proceed with surgical treatment.  Hospital Course: Thomas Stuart was taken to the operating room on 06/14/2014 and underwent a robotic assisted laparoscopic radical prostatectomy. He tolerated this procedure well and without complications. Postoperatively, he was able to be transferred to a regular hospital room following recovery from anesthesia.  He was able to begin ambulating the night of surgery. He remained hemodynamically stable overnight.  He had excellent urine output with appropriately minimal output from his pelvic drain and his pelvic drain was removed on POD #1.  He was transitioned to oral pain medication, tolerated a clear liquid diet, and had met all discharge criteria and was able to be discharged home later on POD#1.  Laboratory values:  Recent Labs  06/14/14 1511 06/15/14 0426  HGB 13.3 11.8*  HCT 40.6 36.3*    Disposition: Home  Discharge instruction: He was instructed to be ambulatory but to refrain from heavy lifting, strenuous activity, or driving. He was instructed on urethral catheter care.  Discharge medications:     Medication List    STOP taking these medications        aspirin 81 MG tablet     finasteride 5 MG tablet  Commonly known as:  PROSCAR     Fish Oil 1000 MG Caps     meloxicam 15 MG tablet  Commonly known as:  MOBIC     multivitamin with minerals tablet     Saw Palmetto (Serenoa repens) 320 MG Caps     tadalafil 10 MG tablet  Commonly known as:  CIALIS      TAKE these medications        benazepril-hydrochlorthiazide 10-12.5 MG per tablet  Commonly known as:  LOTENSIN HCT   Take 1 tablet by mouth every morning.     ciprofloxacin 500 MG tablet  Commonly known as:  CIPRO  Take 1 tablet (500 mg total) by mouth 2 (two) times daily. Start day prior to office visit for foley removal     HYDROcodone-acetaminophen 5-325 MG per tablet  Commonly known as:  NORCO  Take 1-2 tablets by mouth every 6 (six) hours as needed.        Followup: He will followup in 1 week for catheter removal and to discuss his surgical pathology results.

## 2014-06-15 NOTE — Plan of Care (Signed)
Problem: Phase I Progression Outcomes Goal: NPO except ice chips or as ordered Outcome: Completed/Met Date Met:  06/15/14 Goal: Adequate I & O Outcome: Completed/Met Date Met:  06/15/14 Goal: Walk in halls when awake from anesthesia Outcome: Completed/Met Date Met:  06/15/14

## 2014-06-15 NOTE — Anesthesia Postprocedure Evaluation (Signed)
Anesthesia Post Note  Patient: Thomas Stuart [facility]  Procedure(s) Performed: Procedure(s) (LRB): ROBOTIC ASSISTED LAPAROSCOPIC RADICAL PROSTATECTOMY LEVEL 3 (N/A) LYMPHADENECTOMY (Bilateral)  Anesthesia type: General  Patient location: PACU  Post pain: Pain level controlled  Post assessment: Post-op Vital signs reviewed  Last Vitals: BP 126/65 mmHg  Pulse 89  Temp(Src) 36.9 C (Oral)  Resp 18  Ht 5\' 11"  (1.803 m)  Wt 223 lb (101.152 kg)  BMI 31.12 kg/m2  SpO2 99%  Post vital signs: Reviewed  Level of consciousness: sedated  Complications: No apparent anesthesia complications

## 2014-06-16 ENCOUNTER — Inpatient Hospital Stay (HOSPITAL_COMMUNITY): Payer: BC Managed Care – PPO

## 2014-06-16 ENCOUNTER — Inpatient Hospital Stay (HOSPITAL_COMMUNITY)
Admission: AD | Admit: 2014-06-16 | Discharge: 2014-06-21 | DRG: 395 | Disposition: A | Discharge status: Home / Self Care | Payer: BC Managed Care – PPO | Source: Ambulatory Visit | Attending: Urology | Admitting: Urology

## 2014-06-16 ENCOUNTER — Encounter (HOSPITAL_COMMUNITY): Payer: Self-pay | Admitting: *Deleted

## 2014-06-16 ENCOUNTER — Other Ambulatory Visit: Payer: Self-pay | Admitting: Urology

## 2014-06-16 DIAGNOSIS — Z79899 Other long term (current) drug therapy: Secondary | ICD-10-CM

## 2014-06-16 DIAGNOSIS — C61 Malignant neoplasm of prostate: Secondary | ICD-10-CM | POA: Diagnosis present

## 2014-06-16 DIAGNOSIS — D72829 Elevated white blood cell count, unspecified: Secondary | ICD-10-CM | POA: Diagnosis present

## 2014-06-16 DIAGNOSIS — R066 Hiccough: Secondary | ICD-10-CM | POA: Diagnosis present

## 2014-06-16 DIAGNOSIS — R112 Nausea with vomiting, unspecified: Secondary | ICD-10-CM | POA: Diagnosis present

## 2014-06-16 DIAGNOSIS — I10 Essential (primary) hypertension: Secondary | ICD-10-CM | POA: Diagnosis present

## 2014-06-16 DIAGNOSIS — Z87442 Personal history of urinary calculi: Secondary | ICD-10-CM | POA: Diagnosis not present

## 2014-06-16 DIAGNOSIS — E860 Dehydration: Secondary | ICD-10-CM | POA: Diagnosis present

## 2014-06-16 DIAGNOSIS — Z7982 Long term (current) use of aspirin: Secondary | ICD-10-CM | POA: Diagnosis not present

## 2014-06-16 DIAGNOSIS — K913 Postprocedural intestinal obstruction: Secondary | ICD-10-CM | POA: Diagnosis present

## 2014-06-16 DIAGNOSIS — K567 Ileus, unspecified: Secondary | ICD-10-CM

## 2014-06-16 DIAGNOSIS — Z9079 Acquired absence of other genital organ(s): Secondary | ICD-10-CM | POA: Diagnosis present

## 2014-06-16 DIAGNOSIS — R140 Abdominal distension (gaseous): Secondary | ICD-10-CM

## 2014-06-16 DIAGNOSIS — Y839 Surgical procedure, unspecified as the cause of abnormal reaction of the patient, or of later complication, without mention of misadventure at the time of the procedure: Secondary | ICD-10-CM | POA: Diagnosis present

## 2014-06-16 LAB — COMPREHENSIVE METABOLIC PANEL
ALT: 19 U/L (ref 0–53)
AST: 21 U/L (ref 0–37)
Albumin: 3.5 g/dL (ref 3.5–5.2)
Alkaline Phosphatase: 62 U/L (ref 39–117)
Anion gap: 23 — ABNORMAL HIGH (ref 5–15)
BILIRUBIN TOTAL: 0.5 mg/dL (ref 0.3–1.2)
BUN: 17 mg/dL (ref 6–23)
CHLORIDE: 96 meq/L (ref 96–112)
CO2: 18 meq/L — AB (ref 19–32)
CREATININE: 1.67 mg/dL — AB (ref 0.50–1.35)
Calcium: 9.3 mg/dL (ref 8.4–10.5)
GFR, EST AFRICAN AMERICAN: 49 mL/min — AB (ref >90)
GFR, EST NON AFRICAN AMERICAN: 42 mL/min — AB (ref >90)
GLUCOSE: 195 mg/dL — AB (ref 70–99)
Potassium: 4.2 mEq/L (ref 3.7–5.3)
Sodium: 137 mEq/L (ref 137–147)
Total Protein: 6.9 g/dL (ref 6.0–8.3)

## 2014-06-16 LAB — CBC WITH DIFFERENTIAL/PLATELET
BASOS ABS: 0 10*3/uL (ref 0.0–0.1)
Basophils Relative: 0 % (ref 0–1)
EOS ABS: 0 10*3/uL (ref 0.0–0.7)
Eosinophils Relative: 0 % (ref 0–5)
HCT: 34.7 % — ABNORMAL LOW (ref 39.0–52.0)
Hemoglobin: 11.4 g/dL — ABNORMAL LOW (ref 13.0–17.0)
LYMPHS ABS: 1.1 10*3/uL (ref 0.7–4.0)
Lymphocytes Relative: 4 % — ABNORMAL LOW (ref 12–46)
MCH: 28.9 pg (ref 26.0–34.0)
MCHC: 32.9 g/dL (ref 30.0–36.0)
MCV: 88.1 fL (ref 78.0–100.0)
MONO ABS: 2 10*3/uL — AB (ref 0.1–1.0)
Monocytes Relative: 7 % (ref 3–12)
Neutro Abs: 25.1 10*3/uL — ABNORMAL HIGH (ref 1.7–7.7)
Neutrophils Relative %: 89 % — ABNORMAL HIGH (ref 43–77)
PLATELETS: 414 10*3/uL — AB (ref 150–400)
RBC: 3.94 MIL/uL — ABNORMAL LOW (ref 4.22–5.81)
RDW: 13.8 % (ref 11.5–15.5)
WBC: 28.2 10*3/uL — ABNORMAL HIGH (ref 4.0–10.5)

## 2014-06-16 MED ORDER — KETOROLAC TROMETHAMINE 15 MG/ML IJ SOLN
15.0000 mg | Freq: Four times a day (QID) | INTRAMUSCULAR | Status: DC | PRN
  Administered 2014-06-16 – 2014-06-19 (×2): 15 mg via INTRAVENOUS
  Filled 2014-06-16 (×2): qty 1

## 2014-06-16 MED ORDER — DIPHENHYDRAMINE HCL 12.5 MG/5ML PO ELIX: 12.5000 mg | ORAL_SOLUTION | Freq: Four times a day (QID) | ORAL | Status: DC | PRN

## 2014-06-16 MED ORDER — DOCUSATE SODIUM 100 MG PO CAPS
100.0000 mg | ORAL_CAPSULE | Freq: Two times a day (BID) | ORAL | Status: DC
  Administered 2014-06-17 – 2014-06-21 (×8): 100 mg via ORAL
  Filled 2014-06-16 (×11): qty 1

## 2014-06-16 MED ORDER — ZOLPIDEM TARTRATE 5 MG PO TABS: 5.0000 mg | ORAL_TABLET | Freq: Every evening | ORAL | Status: DC | PRN

## 2014-06-16 MED ORDER — KCL IN DEXTROSE-NACL 20-5-0.45 MEQ/L-%-% IV SOLN
INTRAVENOUS | Status: DC
  Administered 2014-06-16 – 2014-06-21 (×10): via INTRAVENOUS
  Filled 2014-06-16 (×18): qty 1000

## 2014-06-16 MED ORDER — ONDANSETRON HCL 4 MG/2ML IJ SOLN
4.0000 mg | INTRAMUSCULAR | Status: DC | PRN
  Administered 2014-06-16 – 2014-06-18 (×6): 4 mg via INTRAVENOUS
  Filled 2014-06-16 (×8): qty 2

## 2014-06-16 MED ORDER — BISACODYL 10 MG RE SUPP
10.0000 mg | Freq: Once | RECTAL | Status: AC
  Administered 2014-06-16: 10 mg via RECTAL
  Filled 2014-06-16: qty 1

## 2014-06-16 MED ORDER — DIPHENHYDRAMINE HCL 50 MG/ML IJ SOLN: 12.5000 mg | Freq: Four times a day (QID) | INTRAMUSCULAR | Status: DC | PRN

## 2014-06-16 MED ORDER — SODIUM CHLORIDE 0.9 % IV BOLUS (SEPSIS)
500.0000 mL | Freq: Once | INTRAVENOUS | Status: AC
  Administered 2014-06-16: 500 mL via INTRAVENOUS

## 2014-06-16 MED ORDER — METOCLOPRAMIDE HCL 5 MG/ML IJ SOLN
5.0000 mg | Freq: Four times a day (QID) | INTRAMUSCULAR | Status: DC
  Administered 2014-06-16 – 2014-06-21 (×21): 5 mg via INTRAVENOUS
  Filled 2014-06-16 (×4): qty 1
  Filled 2014-06-16 (×3): qty 2
  Filled 2014-06-16 (×4): qty 1
  Filled 2014-06-16: qty 2
  Filled 2014-06-16 (×2): qty 1
  Filled 2014-06-16: qty 2
  Filled 2014-06-16: qty 1
  Filled 2014-06-16: qty 2
  Filled 2014-06-16 (×9): qty 1

## 2014-06-16 MED ORDER — ENOXAPARIN SODIUM 30 MG/0.3ML ~~LOC~~ SOLN: 30.0000 mg | SUBCUTANEOUS | Status: DC

## 2014-06-16 MED ORDER — ACETAMINOPHEN 325 MG PO TABS: 650.0000 mg | ORAL_TABLET | ORAL | Status: DC | PRN

## 2014-06-16 MED ORDER — ENOXAPARIN SODIUM 40 MG/0.4ML ~~LOC~~ SOLN
40.0000 mg | SUBCUTANEOUS | Status: DC
  Administered 2014-06-16 – 2014-06-21 (×6): 40 mg via SUBCUTANEOUS
  Filled 2014-06-16 (×6): qty 0.4

## 2014-06-16 NOTE — Progress Notes (Signed)
Pt complaining of being hot and the heat is causing him to vomit. Pt vomited 300 cc of dark emesis. Pt given ice packs to place under arms, AC lowered, and fan was ordered to help cool patient down. Pt given IV zofran to decrease nausea. Will continue to monitor.

## 2014-06-16 NOTE — H&P (Signed)
Chief Complaint Nausea and vomiting   History of Present Illness Thomas Stuart is a 63 year old gentleman who underwent a robotic-assisted laparoscopic radical prostatectomy and pelvic lymphadenectomy on Monday. He was discharged home yesterday in excellent condition with developed severe nausea and vomiting last evening. He has been unable to tolerate any oral intake since last night and has become extremely weak. He denies any fevers or significant pain. He does have abdominal distention has only been passing a small amount of flatus.   Past Medical History Problems  1. History of Disc degeneration, lumbar (M51.36) 2. History of hypertension (Z86.79) 3. History of kidney stones (Z87.442) 4. History of Urinary retention (R33.9)  Surgical History Problems  1. History of Biopsy Of The Prostate Needle 2. History of Biopsy Of The Prostate Needle 3. History of Cystoscopy With Fulguration 4. History of Hernia Repair 5. History of Hernia Repair 6. History of Laparoscopy With Bilateral Total Pelvic Lymphadenectomy 7. History of Lithotripsy 8. History of Prostatect Retropubic Radical W/ Nerve Sparing Laparoscopic 9. History of Transurethral Resection Of Prostate (TURP)  Current Meds 1. Aspirin 81 MG Oral Tablet;  Therapy: (Recorded:26Aug2015) to Recorded 2. Benazepril-Hydrochlorothiazide 20-12.5 MG Oral Tablet;  Therapy: (Recorded:10Nov2014) to Recorded 3. Cialis 20 MG Oral Tablet; TAKE 1 TABLET As Directed PRN;  Therapy: 24Oct2008 to (Last Rx:22Apr2013)  Requested for: 22Apr2013 Ordered 4. Diazepam 10 MG Oral Tablet; Take 1 -2 po one hour prior to procedure;  Therapy: 26Aug2015 to (Last Rx:26Aug2015) Ordered 5. Finasteride 5 MG Oral Tablet; TAKE 1 TABLET DAILY AS DIRECTED;  Therapy: 30Apr2014 to (Evaluate:06May2016)  Requested for: [id number]; Last  Rx:12May2015 Ordered 6. Levofloxacin 500 MG Oral Tablet (Levaquin); Take one tablet daily starting day before  procedure;  Therapy:  26Aug2015 to (Evaluate:29Aug2015)  Requested for: 26Aug2015; Last  Rx:26Aug2015 Ordered 7. Meloxicam 7.5 MG Oral Tablet;  Therapy: 30Apr2014 to Recorded 8. Multi-Vitamin TABS;  Therapy: (Recorded:25Jun2010) to Recorded 9. Vitamin B-12 TABS;  Therapy: (Recorded:25Jun2010) to Recorded 10. Vitamin C TABS;   Therapy: (Recorded:25Jun2010) to Recorded 11. Vitamin D TABS;   Therapy: (Recorded:22Apr2013) to Recorded 12. Vitamin E TABS;   Therapy: (Recorded:30Apr2014) to Recorded  Allergies Medication  1. No Known Drug Allergies  Family History Problems  1. Family history of malignant neoplasm of male breast (Z80.3) : Mother 2. Family history of senile dementia (Z82.0) : Mother 3. Denied: Family history of Prostate Cancer  Social History Problems  1. Denied: Alcohol Use 2. Marital History - Single 3. Never A Smoker 4. 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 No acute distress.  ENT:. The ears and nose are normal in appearance.  Neck: The appearance of the neck is normal and no neck mass is present.  Pulmonary: No respiratory distress, normal respiratory rhythm and effort and clear bilateral breath sounds.  Cardiovascular:. No peripheral edema. Regular and tachycardic.  Abdomen: His abdomen is distended with no bowel sounds. His incisions are clean and dry and intact. His abdomen is mildly tender diffusely but without rebound tenderness or guarding.  Genitourinary: Examination of the penis demonstrates an indwelling catheter. His catheter is draining grossly clear urine.  Skin: Normal skin turgor, no visible rash and no visible skin lesions.  Neuro/Psych:. Mood and affect are appropriate.    Assessment Assessed  1. Prostate cancer (C61)  Probable  postoperative  ileus status post robotic prostatectomy.   Discussion/Summary Mr. Romack appears dehydrated due to his significant nausea and vomiting. Considering his low blood pressure and tachycardia, he does require IV fluid hydration. He will be admitted to the hospital for this reason. He likely has a postoperative ileus as the etiology for his symptoms.   Signatures Electronically signed by : Raynelle Bring, M.D.; Jun 16 2014 11:55AM EST

## 2014-06-16 NOTE — Plan of Care (Signed)
Problem: Phase I Progression Outcomes Goal: Pain controlled with appropriate interventions Outcome: Completed/Met Date Met:  06/16/14 Goal: OOB as tolerated unless otherwise ordered Outcome: Completed/Met Date Met:  06/16/14 Goal: Initial discharge plan identified Outcome: Completed/Met Date Met:  06/16/14 Goal: Voiding-avoid urinary catheter unless indicated Outcome: Completed/Met Date Met:  06/16/14 Goal: Hemodynamically stable Outcome: Completed/Met Date Met:  06/16/14

## 2014-06-17 LAB — BASIC METABOLIC PANEL
Anion gap: 13 (ref 5–15)
BUN: 23 mg/dL (ref 6–23)
CHLORIDE: 101 meq/L (ref 96–112)
CO2: 25 mEq/L (ref 19–32)
CREATININE: 1.18 mg/dL (ref 0.50–1.35)
Calcium: 9.2 mg/dL (ref 8.4–10.5)
GFR calc Af Amer: 75 mL/min — ABNORMAL LOW (ref >90)
GFR, EST NON AFRICAN AMERICAN: 64 mL/min — AB (ref >90)
Glucose, Bld: 195 mg/dL — ABNORMAL HIGH (ref 70–99)
POTASSIUM: 4.1 meq/L (ref 3.7–5.3)
Sodium: 139 mEq/L (ref 137–147)

## 2014-06-17 LAB — URINALYSIS, ROUTINE W REFLEX MICROSCOPIC
BILIRUBIN URINE: NEGATIVE
Glucose, UA: NEGATIVE mg/dL
Ketones, ur: NEGATIVE mg/dL
Leukocytes, UA: NEGATIVE
NITRITE: NEGATIVE
PROTEIN: 30 mg/dL — AB
SPECIFIC GRAVITY, URINE: 1.024 (ref 1.005–1.030)
Urobilinogen, UA: 0.2 mg/dL (ref 0.0–1.0)
pH: 6 (ref 5.0–8.0)

## 2014-06-17 LAB — CBC
HCT: 32.5 % — ABNORMAL LOW (ref 39.0–52.0)
Hemoglobin: 10.4 g/dL — ABNORMAL LOW (ref 13.0–17.0)
MCH: 28.7 pg (ref 26.0–34.0)
MCHC: 32 g/dL (ref 30.0–36.0)
MCV: 89.5 fL (ref 78.0–100.0)
PLATELETS: 368 10*3/uL (ref 150–400)
RBC: 3.63 MIL/uL — ABNORMAL LOW (ref 4.22–5.81)
RDW: 14.1 % (ref 11.5–15.5)
WBC: 28 10*3/uL — ABNORMAL HIGH (ref 4.0–10.5)

## 2014-06-17 LAB — URINE MICROSCOPIC-ADD ON

## 2014-06-17 MED ORDER — BISACODYL 10 MG RE SUPP
10.0000 mg | Freq: Once | RECTAL | Status: AC
  Administered 2014-06-17: 10 mg via RECTAL
  Filled 2014-06-17: qty 1

## 2014-06-17 NOTE — Plan of Care (Signed)
Problem: Phase II Progression Outcomes Goal: Progress activity as tolerated unless otherwise ordered Outcome: Completed/Met Date Met:  06/17/14 Goal: Vital signs remain stable Outcome: Completed/Met Date Met:  06/17/14  Problem: Phase III Progression Outcomes Goal: Pain controlled on oral analgesia Outcome: Completed/Met Date Met:  06/17/14 Goal: Activity at appropriate level-compared to baseline (UP IN CHAIR FOR HEMODIALYSIS)  Outcome: Completed/Met Date Met:  06/17/14

## 2014-06-17 NOTE — Progress Notes (Signed)
Patient ID: Thomas Stuart, male   DOB: Feb 15, 1951, 63 y.o.   MRN: [id number]    Subjective: Pt with minimal flatus overnight and few episodes of emesis.  Still with abdominal distention.  No fever.  He denies CP or SOB.  He continues to have hiccups.  Objective: Vital signs in last 24 hours: Temp:  [97.5 F (36.4 C)-98.8 F (37.1 C)] 97.5 F (36.4 C) (11/12 0450) Pulse Rate:  [116-127] 116 (11/12 0450) Resp:  [18] 18 (11/12 0450) BP: (122-156)/(80-104) 144/104 mmHg (11/12 0450) SpO2:  [95 %-99 %] 95 % (11/12 0450) Weight:  [101.152 kg (223 lb)] 101.152 kg (223 lb) (11/11 1242)  Intake/Output from previous day: 11/11 0701 - 11/12 0700 In: 2212.5 [I.V.:1712.5; IV Piggyback:500] Out: 750 [Urine:750] Intake/Output this shift: Total I/O In: 1375 [I.V.:1375] Out: 550 [Urine:550]  Physical Exam:  General: Alert and oriented CV: Regular, tachycardic Lungs: Clear Abdomen: Distended with no bowel sounds Incisions: C/D/I Ext: NT, No erythema, No edema  Lab Results:  Recent Labs  06/15/14 0426 06/16/14 1318 06/17/14 0430  HGB 11.8* 11.4* 10.4*  HCT 36.3* 34.7* 32.5*   CBC Latest Ref Rng 06/17/2014 06/16/2014 06/15/2014  WBC 4.0 - 10.5 K/uL 28.0(H) 28.2(H) -  Hemoglobin 13.0 - 17.0 g/dL 10.4(L) 11.4(L) 11.8(L)  Hematocrit 39.0 - 52.0 % 32.5(L) 34.7(L) 36.3(L)  Platelets 150 - 400 K/uL 368 414(H) -      BMET  Recent Labs  06/16/14 1318 06/17/14 0430  NA 137 139  K 4.2 4.1  CL 96 101  CO2 18* 25  GLUCOSE 195* 195*  BUN 17 23  CREATININE 1.67* 1.18  CALCIUM 9.3 9.2     Studies/Results: Acute Abdominal Series  06/16/2014   CLINICAL DATA:  Abdominal pain following prostate surgery 3 days previous  EXAM: ACUTE ABDOMEN SERIES (ABDOMEN 2 VIEW & CHEST 1 VIEW)  COMPARISON:  None.  FINDINGS: Cardiac shadow is within normal limits. The lungs are clear bilaterally.  Scattered dilated small bowel and distended large bowel is noted. A paucity of bowel gas is noted in the  rectosigmoid region. These changes likely represent a postoperative ileus. Correlation with the physical exam is recommended. No bony abnormality is noted. A Foley catheter is noted in place.  IMPRESSION: Dilated small bowel with gaseous distension of the large bowel. These changes likely represent a postoperative ileus. Correlation with the physical exam is recommended. Followup imaging is recommended.   Electronically Signed   By: Inez Catalina M.D.   On: 06/16/2014 15:39    Assessment/Plan: POD # 3 s/p RAL radical prostatectomy for prostate cancer - His clinical situation is consistent with postoperative ileus - Continue bowel rest, ambulate, Reglan - Will follow WBC but no other indications to suggest infection at this time, check urine culture    LOS: 1 day   Cami Delawder,LES 06/17/2014, 6:54 AM

## 2014-06-17 NOTE — Plan of Care (Signed)
Problem: Phase II Progression Outcomes Goal: Discharge plan established Outcome: Completed/Met Date Met:  06/17/14     

## 2014-06-18 LAB — BASIC METABOLIC PANEL
ANION GAP: 12 (ref 5–15)
BUN: 18 mg/dL (ref 6–23)
CHLORIDE: 103 meq/L (ref 96–112)
CO2: 25 mEq/L (ref 19–32)
Calcium: 8.8 mg/dL (ref 8.4–10.5)
Creatinine, Ser: 0.84 mg/dL (ref 0.50–1.35)
GFR calc non Af Amer: >90 mL/min (ref >90)
Glucose, Bld: 148 mg/dL — ABNORMAL HIGH (ref 70–99)
POTASSIUM: 4.1 meq/L (ref 3.7–5.3)
SODIUM: 140 meq/L (ref 137–147)

## 2014-06-18 LAB — CBC
HCT: 28.7 % — ABNORMAL LOW (ref 39.0–52.0)
Hemoglobin: 9.2 g/dL — ABNORMAL LOW (ref 13.0–17.0)
MCH: 28.8 pg (ref 26.0–34.0)
MCHC: 32.1 g/dL (ref 30.0–36.0)
MCV: 90 fL (ref 78.0–100.0)
PLATELETS: 372 10*3/uL (ref 150–400)
RBC: 3.19 MIL/uL — ABNORMAL LOW (ref 4.22–5.81)
RDW: 14.2 % (ref 11.5–15.5)
WBC: 23.8 10*3/uL — AB (ref 4.0–10.5)

## 2014-06-18 LAB — URINE CULTURE
Colony Count: NO GROWTH
Culture: NO GROWTH

## 2014-06-18 MED ORDER — BISACODYL 10 MG RE SUPP
10.0000 mg | Freq: Every day | RECTAL | Status: DC | PRN
  Administered 2014-06-18: 10 mg via RECTAL
  Filled 2014-06-18: qty 1

## 2014-06-18 NOTE — Progress Notes (Signed)
Patient ID: Thomas Stuart, male   DOB: 1951/05/11, 63 y.o.   MRN: [id number]  Pt has passed a little more flatus but still very distended and with hiccups.  No real change.  Still without any new pain, fever, or other complaints.  PE: Abdomen is still distended but maybe slightly improved from earlier today. Still no bowel sounds.  Plan: Continue bowel rest and await resolution of ileus.  Follow WBC. Pt still would like to avoid NG tube if possible.

## 2014-06-18 NOTE — Addendum Note (Signed)
Addendum  created 06/18/14 1022 by Salley Scarlet, MD   Modules edited: Anesthesia Responsible Staff

## 2014-06-18 NOTE — Progress Notes (Signed)
Patient ID: Thomas Stuart, male   DOB: 21-Jan-1951, 63 y.o.   MRN: [id number]    Subjective: Pt has passed some flatus but still very distended.  Still with hiccups.  No emesis. Overall, he feels mildly better. He has been ambulating.  Objective: Vital signs in last 24 hours: Temp:  [97.5 F (36.4 C)-98.5 F (36.9 C)] 97.5 F (36.4 C) (11/13 0429) Pulse Rate:  [104-113] 104 (11/13 0429) Resp:  [18] 18 (11/13 0429) BP: (159-168)/(92-96) 161/92 mmHg (11/13 0429) SpO2:  [95 %-97 %] 97 % (11/13 0429)  Intake/Output from previous day: 11/12 0701 - 11/13 0700 In: 3000 [I.V.:3000] Out: 875 [Urine:875] Intake/Output this shift: Total I/O In: 0  Out: 200 [Urine:200]  Physical Exam:  General: Alert and oriented CV: Mildly tachycardic Lungs: Clear Abdomen: Still very distended but slightly improved from yesterday. No bowel sounds. Incisions: C/D/I Ext: NT, No erythema  Lab Results:  Recent Labs  06/16/14 1318 06/17/14 0430 06/18/14 0418  HGB 11.4* 10.4* 9.2*  HCT 34.7* 32.5* 28.7*   BMET  Recent Labs  06/17/14 0430 06/18/14 0418  NA 139 140  K 4.1 4.1  CL 101 103  CO2 25 25  GLUCOSE 195* 148*  BUN 23 18  CREATININE 1.18 0.84  CALCIUM 9.2 8.8   CBC Latest Ref Rng 06/18/2014 06/17/2014 06/16/2014  WBC 4.0 - 10.5 K/uL 23.8(H) 28.0(H) 28.2(H)  Hemoglobin 13.0 - 17.0 g/dL 9.2(L) 10.4(L) 11.4(L)  Hematocrit 39.0 - 52.0 % 28.7(L) 32.5(L) 34.7(L)  Platelets 150 - 400 K/uL 372 368 414(H)      Studies/Results: Acute Abdominal Series  06/16/2014   CLINICAL DATA:  Abdominal pain following prostate surgery 3 days previous  EXAM: ACUTE ABDOMEN SERIES (ABDOMEN 2 VIEW & CHEST 1 VIEW)  COMPARISON:  None.  FINDINGS: Cardiac shadow is within normal limits. The lungs are clear bilaterally.  Scattered dilated small bowel and distended large bowel is noted. A paucity of bowel gas is noted in the rectosigmoid region. These changes likely represent a postoperative ileus. Correlation  with the physical exam is recommended. No bony abnormality is noted. A Foley catheter is noted in place.  IMPRESSION: Dilated small bowel with gaseous distension of the large bowel. These changes likely represent a postoperative ileus. Correlation with the physical exam is recommended. Followup imaging is recommended.   Electronically Signed   By: Inez Catalina M.D.   On: 06/16/2014 15:39    Assessment/Plan:  Ileus s/p robotic prostatectomy - Ileus may be slowly resolving. Continue bowel rest/NPO, Reglan, and IVF hydration.  Pt still wants to avoid an NG tube which is reasonable.  No indication for further imaging at this time. WBC improving.  - Pathology reviewed with patient.  Organ confined disease.  His prognosis is good.      LOS: 2 days   Avonell Lenig,LES 06/18/2014, 11:32 AM

## 2014-06-19 LAB — BASIC METABOLIC PANEL
ANION GAP: 10 (ref 5–15)
BUN: 15 mg/dL (ref 6–23)
CHLORIDE: 104 meq/L (ref 96–112)
CO2: 27 mEq/L (ref 19–32)
Calcium: 8.5 mg/dL (ref 8.4–10.5)
Creatinine, Ser: 0.81 mg/dL (ref 0.50–1.35)
Glucose, Bld: 128 mg/dL — ABNORMAL HIGH (ref 70–99)
POTASSIUM: 4 meq/L (ref 3.7–5.3)
Sodium: 141 mEq/L (ref 137–147)

## 2014-06-19 LAB — CBC
HCT: 26.1 % — ABNORMAL LOW (ref 39.0–52.0)
Hemoglobin: 8.2 g/dL — ABNORMAL LOW (ref 13.0–17.0)
MCH: 29 pg (ref 26.0–34.0)
MCHC: 31.4 g/dL (ref 30.0–36.0)
MCV: 92.2 fL (ref 78.0–100.0)
Platelets: 326 10*3/uL (ref 150–400)
RBC: 2.83 MIL/uL — AB (ref 4.22–5.81)
RDW: 14.4 % (ref 11.5–15.5)
WBC: 16.8 10*3/uL — ABNORMAL HIGH (ref 4.0–10.5)

## 2014-06-19 MED ORDER — VITAMINS A & D EX OINT
TOPICAL_OINTMENT | CUTANEOUS | Status: AC
  Filled 2014-06-19: qty 5

## 2014-06-19 NOTE — Progress Notes (Signed)
Patient ID: Thomas Stuart, male   DOB: 11-15-1950, 63 y.o.   MRN: [id number]    Subjective: Minimal flatus but patient feels he is less distended than yesterday.  Had a fecal incontinence episode with what sounds like mucus this am.  No vomiting, no nausea.  Bothered by hiccups.  No other acute issues.  Objective: Vital signs in last 24 hours: Temp:  [97.7 F (36.5 C)-98.2 F (36.8 C)] 98.2 F (36.8 C) (11/14 0605) Pulse Rate:  [73-100] 73 (11/14 0605) Resp:  [17-20] 20 (11/14 0605) BP: (151-156)/(78-89) 151/78 mmHg (11/14 0605) SpO2:  [95 %-100 %] 100 % (11/14 0605)  Intake/Output from previous day: 11/13 0701 - 11/14 0700 In: 3000 [I.V.:3000] Out: 1350 [Urine:1350] Intake/Output this shift:    Physical Exam:  General: Alert and oriented CV: Mildly tachycardic Lungs: Clear Abdomen: Distended but soft and non-tender, has faint bowel sounds Incisions: C/D/I Ext: NT, No erythema  Lab Results:  Recent Labs  06/17/14 0430 06/18/14 0418 06/19/14 0442  HGB 10.4* 9.2* 8.2*  HCT 32.5* 28.7* 26.1*   BMET  Recent Labs  06/18/14 0418 06/19/14 0442  NA 140 141  K 4.1 4.0  CL 103 104  CO2 25 27  GLUCOSE 148* 128*  BUN 18 15  CREATININE 0.84 0.81  CALCIUM 8.8 8.5   CBC Latest Ref Rng 06/19/2014 06/18/2014 06/17/2014  WBC 4.0 - 10.5 K/uL 16.8(H) 23.8(H) 28.0(H)  Hemoglobin 13.0 - 17.0 g/dL 8.2(L) 9.2(L) 10.4(L)  Hematocrit 39.0 - 52.0 % 26.1(L) 28.7(L) 32.5(L)  Platelets 150 - 400 K/uL 326 372 368   Studies/Results: No results found.  Assessment/Plan:  Ileus s/p robotic prostatectomy.  WBC is improving and urine culture was negative. - Ileus seems to be resolving with less distension and bowel sounds today.  Patient does however continue to burp and hiccup lot which is concerning.   - Will continue NPO today and advance if he starts to pass more flatus - Continue IV fluids - Repeat KUB in AM to evaluate resolution/progression of bowel gas pattern - PRN  suppositories - Repeat CBC in AM   LOS: 3 days   Baltazar Najjar, Will 06/19/2014, 9:45 AM   Seen, examined and discussed with Dr. Baltazar Najjar and agree with assessment and plan.

## 2014-06-20 ENCOUNTER — Inpatient Hospital Stay (HOSPITAL_COMMUNITY): Payer: BC Managed Care – PPO

## 2014-06-20 LAB — CBC
HEMATOCRIT: 24.6 % — AB (ref 39.0–52.0)
Hemoglobin: 7.9 g/dL — ABNORMAL LOW (ref 13.0–17.0)
MCH: 29.7 pg (ref 26.0–34.0)
MCHC: 32.1 g/dL (ref 30.0–36.0)
MCV: 92.5 fL (ref 78.0–100.0)
Platelets: 320 10*3/uL (ref 150–400)
RBC: 2.66 MIL/uL — AB (ref 4.22–5.81)
RDW: 14.7 % (ref 11.5–15.5)
WBC: 13.1 10*3/uL — AB (ref 4.0–10.5)

## 2014-06-20 NOTE — Progress Notes (Signed)
Patient ID: NOLTON DENIS, male   DOB: [birth year], 63 y.o.   MRN: [id number]    Subjective: Patient reports increased flatus since yesterday.  Still with hiccups, not much different than yesterday.  He overall feels better.  Has been walking that halls well.  No nausea or vomiting in the past 24 hours.  Objective: Vital signs in last 24 hours: Temp:  [98 F (36.7 C)-99.1 F (37.3 C)] 98 F (36.7 C) (11/15 0520) Pulse Rate:  [73-91] 73 (11/15 0520) Resp:  [20] 20 (11/14 2121) BP: (136-148)/(69-81) 136/69 mmHg (11/14 2121) SpO2:  [98 %-99 %] 98 % (11/14 2121)  Intake/Output from previous day: 11/14 0701 - 11/15 0700 In: 1500 [I.V.:1500] Out: 750 [Urine:750] Intake/Output this shift:    Physical Exam:  General: Alert and oriented CV: Mildly tachycardic Lungs: Clear Abdomen: Distended but soft and non-tender, has decreased bowel sounds but more robust than yesterday Incisions: C/D/I Ext: NT, No erythema  Lab Results:  Recent Labs  06/18/14 0418 06/19/14 0442 06/20/14 0455  HGB 9.2* 8.2* 7.9*  HCT 28.7* 26.1* 24.6*   BMET  Recent Labs  06/18/14 0418 06/19/14 0442  NA 140 141  K 4.1 4.0  CL 103 104  CO2 25 27  GLUCOSE 148* 128*  BUN 18 15  CREATININE 0.84 0.81  CALCIUM 8.8 8.5   CBC Latest Ref Rng 06/20/2014 06/19/2014 06/18/2014  WBC 4.0 - 10.5 K/uL 13.1(H) 16.8(H) 23.8(H)  Hemoglobin 13.0 - 17.0 g/dL 7.9(L) 8.2(L) 9.2(L)  Hematocrit 39.0 - 52.0 % 24.6(L) 26.1(L) 28.7(L)  Platelets 150 - 400 K/uL 320 326 372   Studies/Results: No results found.  Assessment/Plan:  Ileus s/p robotic prostatectomy.  WBC is improving and urine culture was negative.  Hgb is declining (may be dilutional), but in context of 100 EBL during cases raises concern for a self limited post operative bleed.  He is stable hemodynamically so we will monitor.  - Ileus still seems to be resolving with less distension and bowel sounds today.   - Continue IV fluids - 2 view abd film today  in AM to evaluate resolution/progression of bowel gas pattern - Advance to clears today - PRN suppositories - Repeat H&H in AM given declining HGB   LOS: 4 days   Baltazar Najjar, Will 06/20/2014, 8:51 AM

## 2014-06-20 NOTE — Plan of Care (Signed)
Problem: Phase I Progression Outcomes Goal: Other Phase I Outcomes/Goals Outcome: Completed/Met Date Met:  06/20/14     

## 2014-06-21 LAB — HEMOGLOBIN AND HEMATOCRIT, BLOOD
HEMATOCRIT: 24.6 % — AB (ref 39.0–52.0)
Hemoglobin: 8 g/dL — ABNORMAL LOW (ref 13.0–17.0)

## 2014-06-21 NOTE — Progress Notes (Signed)
Discharge to home, foley catheter in place draining yellow urine, teaching done. D/c instructions and follow up appointment done verbalized understanding. PIV removed no s/s of infiltration or swelling noted.

## 2014-06-21 NOTE — Progress Notes (Signed)
Patient ID: Thomas Stuart, male   DOB: Dec 21, 1950, 63 y.o.   MRN: [id number]    Subjective: Pt had a normal bowel movement yesterday.  No nausea.  Still with some hiccups but now only intermittently and resolved currently.  Objective: Vital signs in last 24 hours: Temp:  [98.2 F (36.8 C)-98.8 F (37.1 C)] 98.4 F (36.9 C) (11/16 0552) Pulse Rate:  [67-76] 67 (11/16 0552) Resp:  [18-20] 20 (11/16 0552) BP: (128-142)/(63-74) 134/65 mmHg (11/16 0552) SpO2:  [98 %-100 %] 100 % (11/16 0552)  Intake/Output from previous day: 11/15 0701 - 11/16 0700 In: 1740 [P.O.:240; I.V.:1500] Out: 1675 [Urine:1675] Intake/Output this shift:    Physical Exam:  General: Alert and oriented CV: RRR Lungs: Clear Abdomen: Only mild distention, soft, positive bowel sounds Incisions: C/D/I GU: Urine clear Ext: NT, No erythema  Lab Results:  Recent Labs  06/19/14 0442 06/20/14 0455 06/21/14 0500  HGB 8.2* 7.9* 8.0*  HCT 26.1* 24.6* 24.6*   BMET  Recent Labs  06/19/14 0442  NA 141  K 4.0  CL 104  CO2 27  GLUCOSE 128*  BUN 15  CREATININE 0.81  CALCIUM 8.5     Assessment/Plan: Resolving ileus: Regular diet, SL IVF, Assess for discharge later today   LOS: 5 days   Jacklynn Dehaas,LES 06/21/2014, 7:18 AM

## 2014-06-21 NOTE — Discharge Instructions (Signed)
1. Activity:  You are encouraged to ambulate frequently (about every hour during waking hours) to help prevent blood clots from forming in your legs or lungs.  However, you should not engage in any heavy lifting (> 10-15 lbs), strenuous activity, or straining. 2. Diet: You should continue a clear liquid diet until passing gas from below.  Once this occurs, you may advance your diet to a soft diet that would be easy to digest (i.e soups, scrambled eggs, mashed potatoes, etc.) for 24 hours just as you would if getting over a bad stomach flu.  If tolerating this diet well for 24 hours, you may then begin eating regular food.  It will be normal to have some amount of bloating, nausea, and abdominal discomfort intermittently. 3. Prescriptions:  You will be provided a prescription for pain medication to take as needed.  If your pain is not severe enough to require the prescription pain medication, you may take extra strength Tylenol instead.  You should also take an over the counter stool softener (Colace 100 mg twice daily) to avoid straining with bowel movements as the pain medication may constipate you. Finally, you will also be provided a prescription for an antibiotic to begin the day prior to your return visit in the office for catheter removal. 4. Catheter care: You will be taught how to take care of the catheter by the nursing staff prior to discharge from the hospital.  You may use both a leg bag and the larger bedside bag but it is recommended to at least use the bigger bedside bag at nighttime as the leg bag is small and will fill up overnight and also does not drain as well when lying flat. You may periodically feel a strong urge to void with the catheter in place.  This is a bladder spasm and most often can occur when having a bowel movement or when you are moving around. It is typically self-limited and usually will stop after a few minutes.  You may use some Vaseline or Neosporin around the tip of the  catheter to reduce friction at the tip of the penis. 5. What to call us about: You should call the office [phone]) if you develop fever > 101, persistent vomiting, or the catheter stops draining. Also, feel free to call with any other questions you may have and remember the handout that was provided to you as a reference preoperatively which answers many of the common questions that arise after surgery.

## 2014-06-21 NOTE — Discharge Summary (Signed)
  Date of admission: 06/16/2014  Date of discharge: 06/21/2014  Admission diagnosis: Prostate cancer, postoperative ileus  Discharge diagnosis: Same as above  History and Physical: For full details, please see admission history and physical. Briefly, Thomas Stuart is a 63 y.o. year old patient with prostate cancer status post a robotic prostatectomy last week. His initial postoperative course was uncomplicated and he was discharged home on postoperative day 1.  He developed severe nausea and vomiting and presented to the office on postoperative day 2 with a distended abdomen, tachycardia, and evidence of dehydration.  Hospital Course: He was admitted to the hospital on 06/16/14 with dehydration and what appeared to be a clinical exam consistent with a postoperative ileus.  An acute abdominal series confirmed what appeared to be an ileus and he was treated with bowel rest and IV fluid hydration and initially.  He was also noted to have a leukocytosis without any other evidence of infection.  A urine culture was negative.  His leukocytosis eventually resolved without intervention.  He was treated with promotility drugs and is ileus gradually resolved.  He did begin passing flatus and subsequently had our movement in his clinical exam was significantly improved.  By 06/21/14, he was able to tolerate a regular diet and was having multiple bowel movements.  He was felt stable for discharge home.  Laboratory values:  Recent Labs  06/19/14 0442 06/20/14 0455 06/21/14 0500  HGB 8.2* 7.9* 8.0*  HCT 26.1* 24.6* 24.6*    Recent Labs  06/19/14 0442  CREATININE 0.81    Disposition: Home  Discharge instruction: The patient was instructed to be ambulatory but told to refrain from heavy lifting, strenuous activity, or driving.   Discharge medications:    Medication List    TAKE these medications        benazepril-hydrochlorthiazide 10-12.5 MG per tablet  Commonly known as:  LOTENSIN HCT   Take 1 tablet by mouth every morning.     ciprofloxacin 500 MG tablet  Commonly known as:  CIPRO  Take 1 tablet (500 mg total) by mouth 2 (two) times daily. Start day prior to office visit for foley removal     HYDROcodone-acetaminophen 5-325 MG per tablet  Commonly known as:  NORCO  Take 1-2 tablets by mouth every 6 (six) hours as needed.        Followup:      Follow-up Information    Follow up with Dutch Gray, MD.   Specialty:  Urology   Why:  07/22/14 at noon   Contact information:   [address] [place] [address] [phone]

## 2014-07-13 ENCOUNTER — Encounter (HOSPITAL_BASED_OUTPATIENT_CLINIC_OR_DEPARTMENT_OTHER): Payer: Self-pay | Admitting: Urology

## 2014-07-13 NOTE — Addendum Note (Signed)
Addendum  created 07/13/14 1405 by Suan Halter, CRNA   Modules edited: Anesthesia Events

## 2014-07-13 NOTE — Addendum Note (Signed)
Addendum  created 07/13/14 1357 by Suan Halter, CRNA   Modules edited: Anesthesia Events, Anesthesia Flowsheet

## 2015-10-20 ENCOUNTER — Encounter: Payer: Self-pay | Admitting: Internal Medicine

## 2016-01-31 DIAGNOSIS — N529 Male erectile dysfunction, unspecified: Secondary | ICD-10-CM | POA: Insufficient documentation

## 2016-01-31 DIAGNOSIS — M722 Plantar fascial fibromatosis: Secondary | ICD-10-CM | POA: Insufficient documentation

## 2016-01-31 DIAGNOSIS — M1990 Unspecified osteoarthritis, unspecified site: Secondary | ICD-10-CM | POA: Insufficient documentation

## 2016-01-31 DIAGNOSIS — I10 Essential (primary) hypertension: Secondary | ICD-10-CM | POA: Insufficient documentation

## 2016-02-01 DIAGNOSIS — Z9079 Acquired absence of other genital organ(s): Secondary | ICD-10-CM | POA: Insufficient documentation

## 2016-02-01 DIAGNOSIS — D170 Benign lipomatous neoplasm of skin and subcutaneous tissue of head, face and neck: Secondary | ICD-10-CM | POA: Insufficient documentation

## 2016-02-12 DIAGNOSIS — E6609 Other obesity due to excess calories: Secondary | ICD-10-CM | POA: Insufficient documentation

## 2016-02-21 LAB — BASIC METABOLIC PANEL
BUN: 11 mg/dL (ref 4–21)
CREATININE: 0.9 mg/dL (ref <=1.3)
GLUCOSE: 107 mg/dL
POTASSIUM: 3.6 mmol/L (ref 3.4–5.3)
SODIUM: 139 mmol/L (ref 137–147)

## 2016-02-21 LAB — CBC AND DIFFERENTIAL
HCT: 44 % (ref 41–53)
Hemoglobin: 14.7 g/dL (ref 13.5–17.5)
Neutrophils Absolute: 5 /uL
Platelets: 284 10*3/uL (ref 150–399)
WBC: 9.2 10*3/mL

## 2016-02-21 LAB — LIPID PANEL
Cholesterol: 160 mg/dL (ref 0–200)
HDL: 39 mg/dL (ref 35–70)
LDL Cholesterol: 111 mg/dL
Triglycerides: 122 mg/dL (ref 40–160)

## 2016-02-21 LAB — TSH: TSH: 1.31 u[IU]/mL (ref <=5.90)

## 2016-06-05 ENCOUNTER — Telehealth: Payer: Self-pay | Admitting: Internal Medicine

## 2016-06-05 ENCOUNTER — Encounter: Payer: Self-pay | Admitting: Internal Medicine

## 2016-06-05 NOTE — Telephone Encounter (Signed)
Patient with blood in his stool since Sunday.  He will come in and see Ellouise Newer, PA tomorrow at 11:15

## 2016-06-06 ENCOUNTER — Other Ambulatory Visit: Payer: Self-pay

## 2016-06-06 ENCOUNTER — Ambulatory Visit (INDEPENDENT_AMBULATORY_CARE_PROVIDER_SITE_OTHER): Payer: BLUE CROSS/BLUE SHIELD | Admitting: Physician Assistant

## 2016-06-06 ENCOUNTER — Encounter: Payer: Self-pay | Admitting: Physician Assistant

## 2016-06-06 ENCOUNTER — Other Ambulatory Visit (INDEPENDENT_AMBULATORY_CARE_PROVIDER_SITE_OTHER): Payer: BLUE CROSS/BLUE SHIELD

## 2016-06-06 ENCOUNTER — Encounter (INDEPENDENT_AMBULATORY_CARE_PROVIDER_SITE_OTHER): Payer: Self-pay

## 2016-06-06 VITALS — BP 128/64 | HR 80 | Ht 69.49 in | Wt 230.0 lb

## 2016-06-06 DIAGNOSIS — Z86010 Personal history of colonic polyps: Secondary | ICD-10-CM

## 2016-06-06 DIAGNOSIS — K921 Melena: Secondary | ICD-10-CM

## 2016-06-06 DIAGNOSIS — D649 Anemia, unspecified: Secondary | ICD-10-CM

## 2016-06-06 LAB — CBC WITH DIFFERENTIAL/PLATELET
BASOS ABS: 0 10*3/uL (ref 0.0–0.1)
Basophils Relative: 0.3 % (ref 0.0–3.0)
EOS ABS: 0.1 10*3/uL (ref 0.0–0.7)
Eosinophils Relative: 0.8 % (ref 0.0–5.0)
HEMATOCRIT: 40.9 % (ref 39.0–52.0)
HEMOGLOBIN: 13.7 g/dL (ref 13.0–17.0)
LYMPHS PCT: 23.9 % (ref 12.0–46.0)
Lymphs Abs: 2.8 10*3/uL (ref 0.7–4.0)
MCHC: 33.5 g/dL (ref 30.0–36.0)
MCV: 87.1 fl (ref 78.0–100.0)
MONOS PCT: 7.6 % (ref 3.0–12.0)
Monocytes Absolute: 0.9 10*3/uL (ref 0.1–1.0)
Neutro Abs: 8 10*3/uL — ABNORMAL HIGH (ref 1.4–7.7)
Neutrophils Relative %: 67.4 % (ref 43.0–77.0)
Platelets: 336 10*3/uL (ref 150.0–400.0)
RBC: 4.7 Mil/uL (ref 4.22–5.81)
RDW: 14 % (ref 11.5–15.5)
WBC: 11.9 10*3/uL — AB (ref 4.0–10.5)

## 2016-06-06 MED ORDER — NA SULFATE-K SULFATE-MG SULF 17.5-3.13-1.6 GM/177ML PO SOLN: 1.0000 | Freq: Once | ORAL | 0 refills | Status: AC

## 2016-06-06 NOTE — Patient Instructions (Signed)
Your physician has requested that you go to the basement for lab work before leaving today.  You have been scheduled for a colonoscopy. Please follow written instructions given to you at your visit today.  Please pick up your prep supplies at the pharmacy within the next 1-3 days. If you use inhalers (even only as needed), please bring them with you on the day of your procedure. Your physician has requested that you go to www.startemmi.com and enter the access code given to you at your visit today. This web site gives a general overview about your procedure. However, you should still follow specific instructions given to you by our office regarding your preparation for the procedure.   

## 2016-06-06 NOTE — Progress Notes (Signed)
Chief Complaint: Hematochezia  HPI:  Thomas Stuart is a 65 year old male with a past medical history of arthritis, hypertension, colon polyps, prostate cancer and urinary retention, who presents to clinic today with a complaint of hematochezia. Patient has followed with Dr. Carlean Purl in the past for screening colonoscopy. His last one was performed 06/15/11 and was normal. It was noted he had a personal history of adenomatous polyps in 2004 and 2009. Repeat colonoscopy was recommended in 5 years for screening.    Today, the patient tells me that Sunday morning he woke up for church and had a bowel movement which was a red in color and saw some bright red blood on the toilet paper when he wiped, this occurred again on Tuesday and this morning. Each day he has only had 1 stool which has had some bright red blood mixed in with it. He denies any changes in his diet or previous episodes of the same. 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    Patient denies fever, chills, abdominal pain, rectal pain, use of blood thinners, use of NSAIDs, recent increase in straining or change in bowel habits, diarrhea or constipation.    Past Medical History:  Diagnosis Date  . Arthritis    lower back  . BPH (benign prostatic hyperplasia)   . Erectile dysfunction   . Hematuria   . Hypertension   . Lumbar disc disease   . Nephrolithiasis   . Personal history of colonic polyps 2004, 2009   adenomas  . 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 PSA elevation   . Urinary retention     Past Surgical History:  Procedure Laterality Date  . APPENDECTOMY    . COLONOSCOPY  2004, 03/2008, 06/15/2011   2004 - 6 mm polyp, 2009 4 adenomas max 10 mm 2012  . CYSTOSCOPY  2010   with fulgeration  . CYSTOSCOPY  2006    w/  stone removal  . CYSTOSCOPY N/A 04/15/2014   Procedure: CYSTOSCOPY , WITH PROSTATE  BIOPSY;  Surgeon: Malka So, MD;  Location: [facility];  Service: Urology;  Laterality: N/A;  . INGUINAL HERNIA REPAIR  2011   left  . LITHOTRIPSY  2008   kidney stone  . LYMPHADENECTOMY Bilateral 06/14/2014   Procedure: LYMPHADENECTOMY;  Surgeon: Raynelle Bring, MD;  Location: [facility];  Service: Urology;  Laterality: Bilateral;  . PROSTATE BIOPSY  01/2007   benign, TURP  . PROSTATE BIOPSY N/A 04/15/2014   Procedure: PROSTATE ULTRASOUND/BIOPSY ;  Surgeon: Malka So, MD;  Location: [facility];  Service: Urology;  Laterality: N/A;  . ROBOT ASSISTED LAPAROSCOPIC RADICAL PROSTATECTOMY N/A 06/14/2014   Procedure: ROBOTIC ASSISTED LAPAROSCOPIC RADICAL PROSTATECTOMY LEVEL 3;  Surgeon: Raynelle Bring, MD;  Location: [facility];  Service: Urology;  Laterality: N/A;  . TONSILLECTOMY    . TRANSURETHRAL RESECTION OF PROSTATE  2007    Current Outpatient Prescriptions  Medication Sig Dispense Refill  . benazepril-hydrochlorthiazide (LOTENSIN HCT) 10-12.5 MG per tablet Take 1 tablet by mouth every morning.     . ciprofloxacin (CIPRO) 500 MG tablet Take 1 tablet (500 mg total) by mouth 2 (two) times daily. Start day prior to office visit for foley removal 6 tablet 0  . HYDROcodone-acetaminophen (  NORCO) 5-325 MG per tablet Take 1-2 tablets by mouth every 6 (six) hours as needed. 30 tablet 0   No current facility-administered medications for this visit.     Allergies as of 06/06/2016  . (No Known Allergies)    Family History  Problem Relation Age of Onset  . Breast cancer Mother 77  . Dementia Mother   . Cancer Father 44    prostate    Social History   Social History  . Marital status: Single    Spouse name: N/A  . Number of children: N/A  . Years of education: N/A   Occupational History  . Not on file.   Social History Main Topics  . Smoking status: Never Smoker  .  Smokeless tobacco: Never Used  . Alcohol use Yes     Comment: occasionaly  . Drug use: No  . Sexual activity: Yes   Other Topics Concern  . Not on file   Social History Narrative  . No narrative on file    Review of Systems:     Constitutional: No weight loss, fever, chills, weakness or fatigue HEENT: Eyes: No change in vision               Ears, Nose, Throat:  No change in hearing or congestion Skin: No rash or itching Cardiovascular: No chest pain, chest pressure or palpitations   Respiratory: No SOB or cough Gastrointestinal: See HPI and otherwise negative Genitourinary: No dysuria or change in urinary frequency Neurological: No headache, dizziness or syncope Musculoskeletal: No new muscle or joint pain Hematologic: No bleeding or bruising Psychiatric: No history of depression or anxiety    Physical Exam:  Vital signs: BP 128/64   Pulse 80   Ht 5' 9.49" (1.765 m)   Wt 230 lb (104.3 kg)   BMI 33.49 kg/m    General:   Pleasant African American male appears to be in NAD, Well developed, Well nourished, alert and cooperative Head:  Normocephalic and atraumatic. Eyes:   PEERL, EOMI. No icterus. Conjunctiva pink. Ears:  Normal auditory acuity. Neck:  Supple Throat: Oral cavity and pharynx without inflammation, swelling or lesion.  Lungs: Respirations even and unlabored. Lungs clear to auscultation bilaterally.   No wheezes, crackles, or rhonchi.  Heart: Normal S1, S2. No MRG. Regular rate and rhythm. No peripheral edema, cyanosis or pallor.  Abdomen:  Soft, nondistended, nontender. No rebound or guarding. Normal bowel sounds. No appreciable masses or hepatomegaly. Rectal: External exam: No external lesions, hemorrhoids or mass; internal exam: No internal mass, no discharge, no tenderness with palpation Msk:  Symmetrical without gross deformities. Peripheral pulses intact.  Extremities:  Without edema, no deformity or joint abnormality. Normal ROM. Neurologic:  Alert and   oriented x4;  grossly normal neurologically. CN II-XII intact.  Skin:   Dry and intact without significant lesions or rashes. Psychiatric: Oriented to person, place and time. Demonstrates good judgement and reason without abnormal affect or behaviors.  No recent labs or imaging.  Assessment: 1. Hematochezia: Started Sunday morning, patient has had one stool once daily for the past 3 days which has looked red in color with bright red blood on the toilet paper after wiping, history of prostatectomy without radiation, remote history of external hemorrhoid; consider hemorrhoids versus diverticular bleed versus other 2. History of adenomatous polyps: Last colonoscopy November 2012, recommendations for repeat in 5 years, patient is due at this time.  Plan: 1. Recommend proceeding with colonoscopy for further evaluation of recent rectal bleeding  and for screening purposes. Discussed risks, benefits, limitations and alternatives and the patient agrees to proceed. This was scheduled with Dr. Carlean Purl in the [facility].  His previously scheduled colonoscopy was moved up. 2. Ordered CBC for further evaluation of anemia 3. Recommend the patient maintain regular soft bowel movements, avoid straining. 4. Discussed with the patient that if he sees an increase in the amount or frequency of bleeding or starts to feel dizzy, fatigued or SOB, he should call our office and/or proceed to the ER 5. Patient will follow Dr. Carlean Purl per his recommendations after time of procedure.  Ellouise Newer, PA-C [facility] 06/06/2016, 11:11 AM  Cc: Christain Sacramento, MD

## 2016-06-07 NOTE — Progress Notes (Signed)
Agree w/ management Gatha Mayer, MD, [facility]

## 2016-06-13 ENCOUNTER — Encounter: Payer: Self-pay | Admitting: Internal Medicine

## 2016-06-25 ENCOUNTER — Ambulatory Visit (AMBULATORY_SURGERY_CENTER): Payer: BLUE CROSS/BLUE SHIELD | Admitting: Internal Medicine

## 2016-06-25 ENCOUNTER — Encounter: Payer: Self-pay | Admitting: Internal Medicine

## 2016-06-25 ENCOUNTER — Other Ambulatory Visit (INDEPENDENT_AMBULATORY_CARE_PROVIDER_SITE_OTHER): Payer: BLUE CROSS/BLUE SHIELD

## 2016-06-25 VITALS — BP 142/86 | HR 82 | Temp 99.3°F | Resp 14 | Ht 69.0 in | Wt 230.0 lb

## 2016-06-25 DIAGNOSIS — Z86010 Personal history of colonic polyps: Secondary | ICD-10-CM

## 2016-06-25 DIAGNOSIS — D649 Anemia, unspecified: Secondary | ICD-10-CM

## 2016-06-25 DIAGNOSIS — D123 Benign neoplasm of transverse colon: Secondary | ICD-10-CM

## 2016-06-25 LAB — CBC WITH DIFFERENTIAL/PLATELET
BASOS ABS: 0 10*3/uL (ref 0.0–0.1)
Basophils Relative: 0.3 % (ref 0.0–3.0)
Eosinophils Absolute: 0.1 10*3/uL (ref 0.0–0.7)
Eosinophils Relative: 1 % (ref 0.0–5.0)
HEMATOCRIT: 42.1 % (ref 39.0–52.0)
Hemoglobin: 13.9 g/dL (ref 13.0–17.0)
LYMPHS ABS: 3.6 10*3/uL (ref 0.7–4.0)
LYMPHS PCT: 28.8 % (ref 12.0–46.0)
MCHC: 33 g/dL (ref 30.0–36.0)
MCV: 86.4 fl (ref 78.0–100.0)
MONOS PCT: 9.2 % (ref 3.0–12.0)
Monocytes Absolute: 1.2 10*3/uL — ABNORMAL HIGH (ref 0.1–1.0)
NEUTROS PCT: 60.7 % (ref 43.0–77.0)
Neutro Abs: 7.6 10*3/uL (ref 1.4–7.7)
Platelets: 376 10*3/uL (ref 150.0–400.0)
RBC: 4.87 Mil/uL (ref 4.22–5.81)
RDW: 13.6 % (ref 11.5–15.5)
WBC: 12.6 10*3/uL — AB (ref 4.0–10.5)

## 2016-06-25 MED ORDER — SODIUM CHLORIDE 0.9 % IV SOLN: 500.0000 mL | INTRAVENOUS | Status: DC

## 2016-06-25 NOTE — Progress Notes (Signed)
Called to room to assist during endoscopic procedure.  Patient ID and intended procedure confirmed with present staff. Received instructions for my participation in the procedure from the performing physician.  

## 2016-06-25 NOTE — Op Note (Signed)
[facility] Patient Name: Thomas Stuart Procedure Date: 06/25/2016 3:46 PM MRN: [id number] Endoscopist: Gatha Mayer , MD Age: 65 Referring MD:  Date of Birth: Mar 24, 1951 Gender: Male Account #: [id number] Procedure:                Colonoscopy Indications:              Surveillance: Personal history of adenomatous                            polyps on last colonoscopy 5 years ago Medicines:                Propofol per Anesthesia, Monitored Anesthesia Care Procedure:                Pre-Anesthesia Assessment:                           - Prior to the procedure, a History and Physical                            was performed, and patient medications and                            allergies were reviewed. The patient's tolerance of                            previous anesthesia was also reviewed. The risks                            and benefits of the procedure and the sedation                            options and risks were discussed with the patient.                            All questions were answered, and informed consent                            was obtained. Prior Anticoagulants: The patient has                            taken no previous anticoagulant or antiplatelet                            agents. ASA Grade Assessment: II - A patient with                            mild systemic disease. After reviewing the risks                            and benefits, the patient was deemed in                            satisfactory condition to undergo the procedure.  After obtaining informed consent, the colonoscope                            was passed under direct vision. Throughout the                            procedure, the patient's blood pressure, pulse, and                            oxygen saturations were monitored continuously. The                            Model CF-HQ190L [id number]) scope was introduced   through the anus and advanced to the the cecum,                            identified by appendiceal orifice and ileocecal                            valve. The colonoscopy was performed without                            difficulty. The patient tolerated the procedure                            well. The quality of the bowel preparation was                            excellent. The bowel preparation used was Miralax.                            The ileocecal valve, appendiceal orifice, and                            rectum were photographed. Scope In: 4:05:22 PM Scope Out: 4:21:11 PM Scope Withdrawal Time: 0 hours 11 minutes 13 seconds  Total Procedure Duration: 0 hours 15 minutes 49 seconds  Findings:                 The perianal examination was normal.                           The digital rectal exam findings include surgically                            absent prostate.                           Two sessile polyps were found in the transverse                            colon. The polyps were 3 to 4 mm in size. These                            polyps were removed with a cold snare.  Resection                            and retrieval were complete. Verification of                            patient identification for the specimen was done.                            Estimated blood loss was minimal.                           The exam was otherwise without abnormality on                            direct and retroflexion views. Complications:            No immediate complications. Estimated Blood Loss:     Estimated blood loss was minimal. 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                           - Two 3 to 4 mm polyps in the transverse colon,                            removed with a cold snare. Resected and retrieved.                           - The examination was otherwise normal on direct                             and retroflexion views.                           - Personal history of colonic polyps. Recommendation:           - Patient has a contact number available for                            emergencies. The signs and symptoms of potential                            delayed complications were discussed with the                            patient. Return to normal activities tomorrow.                            Written discharge instructions were provided to the                            patient.                           -  Resume previous diet.                           - Continue present medications.                           - Repeat colonoscopy is recommended. The                            colonoscopy date will be determined after pathology                            results from today's exam become available for                            review. Gatha Mayer, MD 06/25/2016 4:28:51 PM This report has been signed electronically.

## 2016-06-25 NOTE — Progress Notes (Signed)
TO recovery, report to Doyle Askew, RN, VSS.

## 2016-06-25 NOTE — Patient Instructions (Addendum)
I found and removed 2 small polyps that look benign. I will let you know pathology results and when to have another routine colonoscopy by mail.  I did not see inflamed hemorrhoids today but suspect that is what bled - if you have more problems let me know - can fix them with hemorrhoid banding. Done in office and not painful.  I appreciate the opportunity to care for you. Gatha Mayer, MD, [facility]  Handout given on polyps. Call us with any questions or concerns. Thank you!  YOU HAD AN ENDOSCOPIC PROCEDURE TODAY AT [facility]:   Refer to the procedure report that was given to you for any specific questions about what was found during the examination.  If the procedure report does not answer your questions, please call your gastroenterologist to clarify.  If you requested that your care partner not be given the details of your procedure findings, then the procedure report has been included in a sealed envelope for you to review at your convenience later.  YOU SHOULD EXPECT: Some feelings of bloating in the abdomen. Passage of more gas than usual.  Walking can help get rid of the air that was put into your GI tract during the procedure and reduce the bloating. If you had a lower endoscopy (such as a colonoscopy or flexible sigmoidoscopy) you may notice spotting of blood in your stool or on the toilet paper. If you underwent a bowel prep for your procedure, you may not have a normal bowel movement for a few days.  Please Note:  You might notice some irritation and congestion in your nose or some drainage.  This is from the oxygen used during your procedure.  There is no need for concern and it should clear up in a day or so.  SYMPTOMS TO REPORT IMMEDIATELY:   Following lower endoscopy (colonoscopy or flexible sigmoidoscopy):  Excessive amounts of blood in the stool  Significant tenderness or worsening of abdominal pains  Swelling of the abdomen that is new, acute  Fever  of 100F or higher   For urgent or emergent issues, a gastroenterologist can be reached at any hour by calling [phone].   DIET:  We do recommend a small meal at first, but then you may proceed to your regular diet.  Drink plenty of fluids but you should avoid alcoholic beverages for 24 hours.  ACTIVITY:  You should plan to take it easy for the rest of today and you should NOT DRIVE or use heavy machinery until tomorrow (because of the sedation medicines used during the test).    FOLLOW UP: Our staff will call the number listed on your records the next business day following your procedure to check on you and address any questions or concerns that you may have regarding the information given to you following your procedure. If we do not reach you, we will leave a message.  However, if you are feeling well and you are not experiencing any problems, there is no need to return our call.  We will assume that you have returned to your regular daily activities without incident.  If any biopsies were taken you will be contacted by phone or by letter within the next 1-3 weeks.  Please call us at [phone] if you have not heard about the biopsies in 3 weeks.    SIGNATURES/CONFIDENTIALITY: You and/or your care partner have signed paperwork which will be entered into your electronic medical record.  These signatures  attest to the fact that that the information above on your After Visit Summary has been reviewed and is understood.  Full responsibility of the confidentiality of this discharge information lies with you and/or your care-partner.

## 2016-06-26 ENCOUNTER — Telehealth: Payer: Self-pay | Admitting: *Deleted

## 2016-06-26 NOTE — Telephone Encounter (Signed)
  Follow up Call-  Call back number 06/25/2016  Post procedure Call Back phone  # [phone]  Permission to leave phone message Yes  Some recent data might be hidden     Patient questions:  Do you have a fever, pain , or abdominal swelling? No. Pain Score  0 *  Have you tolerated food without any problems? Yes.    Have you been able to return to your normal activities? Yes.    Do you have any questions about your discharge instructions: Diet   No. Medications  No. Follow up visit  No.  Do you have questions or concerns about your Care? No.  Actions: * If pain score is 4 or above: No action needed, pain <4.

## 2016-07-01 ENCOUNTER — Encounter: Payer: Self-pay | Admitting: Internal Medicine

## 2016-07-01 DIAGNOSIS — Z86010 Personal history of colonic polyps: Secondary | ICD-10-CM

## 2016-07-01 NOTE — Progress Notes (Signed)
Diminutive adenoma and diminutive hyperplastic polyp Recall 2022

## 2016-07-19 ENCOUNTER — Encounter: Payer: Self-pay | Admitting: Internal Medicine

## 2016-11-15 ENCOUNTER — Encounter: Payer: Self-pay | Admitting: Emergency Medicine

## 2016-11-27 ENCOUNTER — Encounter: Payer: Self-pay | Admitting: Physician Assistant

## 2016-11-27 ENCOUNTER — Ambulatory Visit (INDEPENDENT_AMBULATORY_CARE_PROVIDER_SITE_OTHER): Payer: BLUE CROSS/BLUE SHIELD | Admitting: Physician Assistant

## 2016-11-27 VITALS — BP 132/80 | HR 87 | Temp 98.3°F | Resp 14 | Ht 71.0 in | Wt 225.0 lb

## 2016-11-27 DIAGNOSIS — I10 Essential (primary) hypertension: Secondary | ICD-10-CM

## 2016-11-27 DIAGNOSIS — R21 Rash and other nonspecific skin eruption: Secondary | ICD-10-CM | POA: Diagnosis not present

## 2016-11-27 DIAGNOSIS — Z86010 Personal history of colonic polyps: Secondary | ICD-10-CM | POA: Diagnosis not present

## 2016-11-27 DIAGNOSIS — Z8546 Personal history of malignant neoplasm of prostate: Secondary | ICD-10-CM

## 2016-11-27 LAB — COMPREHENSIVE METABOLIC PANEL
ALBUMIN: 4.5 g/dL (ref 3.5–5.2)
ALK PHOS: 67 U/L (ref 39–117)
ALT: 24 U/L (ref 0–53)
AST: 20 U/L (ref 0–37)
BILIRUBIN TOTAL: 0.6 mg/dL (ref 0.2–1.2)
BUN: 11 mg/dL (ref 6–23)
CO2: 28 mEq/L (ref 19–32)
CREATININE: 0.91 mg/dL (ref 0.40–1.50)
Calcium: 9.5 mg/dL (ref 8.4–10.5)
Chloride: 103 mEq/L (ref 96–112)
GFR: 107.63 mL/min (ref >60.00)
Glucose, Bld: 111 mg/dL — ABNORMAL HIGH (ref 70–99)
Potassium: 4.2 mEq/L (ref 3.5–5.1)
Sodium: 138 mEq/L (ref 135–145)
Total Protein: 6.8 g/dL (ref 6.0–8.3)

## 2016-11-27 LAB — LIPID PANEL
CHOL/HDL RATIO: 4
Cholesterol: 185 mg/dL (ref 0–200)
HDL: 45.9 mg/dL (ref >39.00)
LDL CALC: 117 mg/dL — AB (ref 0–99)
NonHDL: 139.06
TRIGLYCERIDES: 111 mg/dL (ref 0.0–149.0)
VLDL: 22.2 mg/dL (ref 0.0–40.0)

## 2016-11-27 LAB — HEMOGLOBIN A1C: HEMOGLOBIN A1C: 6.6 % — AB (ref 4.6–6.5)

## 2016-11-27 MED ORDER — CLOTRIMAZOLE-BETAMETHASONE 1-0.05 % EX CREA: 1.0000 "application " | TOPICAL_CREAM | Freq: Two times a day (BID) | CUTANEOUS | 0 refills | Status: DC

## 2016-11-27 NOTE — Assessment & Plan Note (Signed)
BP controlled. Asymptomatic. Will update labs today as patient is fasting. Continue current regimen.

## 2016-11-27 NOTE — Progress Notes (Signed)
Pre visit review using our clinic review tool, if applicable. No additional management support is needed unless otherwise documented below in the visit note. 

## 2016-11-27 NOTE — Assessment & Plan Note (Signed)
s/p radical prostatectomy in 2015.  Followed by [facility] (Dr. Alinda Money)

## 2016-11-27 NOTE — Progress Notes (Signed)
Patient presents to clinic today to establish care. Body mass index is 31.38 kg/m. Diet -- Endorses well-balanced overall. Is trying to cut back on use of fried foods. Avoiding red meats. Exercise -- Is trying to walk 3-4 times per week, averaging 1 mile each time. Is working on increasing each week.  Acute Concerns: Patient endorses noting a lesion of inner left ankle x 1 month. Endorses the area is annular and sometimes pruritic. Is non painful. Denies drainage. Denies change in size since onset. Notes some darkening of lesion but notes this happened after scratching the area excessively.   Chronic Issues: History of Prostate Cancer -- s/p radical prostatectomy in 2015. Followed by [facility] (Dr. Alinda Money). Has appointments still at 6 months. States he has follow-up in July and if everything is good, they are moving follow-ups to once a year.  History of Adenomatous Colon Polyps -- Followed by [facility] Carlean Purl). Last colonoscopy a few months prior. Benign adenoma noted. Repeat in 5 years per GI.   Hypertension -- Patient is currently on a regimen of Lotensin-HCT 10-12.5 mg daily. Is taking medications as directed. Patient denies chest pain, palpitations, lightheadedness, dizziness, vision changes or frequent headaches. Is also on 81 mg ASA daily. Denies history of stroke or heart attack. Denies history of hyperlipidemia.  BP Readings from Last 3 Encounters:  11/27/16 132/80  06/25/16 (!) 142/86  06/06/16 128/64   Health Maintenance: Immunizations -- Flu shot up-to-date. Tetanus is up-to-date. Colonoscopy -- up-to-date. 06/25/2016. Adenoma. Repeat 2020. Followed by [facility] -- Dr. Carlean Purl HIV Screen -- Declines Hep C Screen -- Declines  Past Medical History:  Diagnosis Date  . Arthritis    lower back  . BPH (benign prostatic hyperplasia)   . Erectile dysfunction   . Hematuria   . Hypertension   . Lumbar disc disease   . Nephrolithiasis   . Personal history of colonic  polyps 2004, 2009, 2017   adenomas  . Prostate cancer ([place]) 04/15/14   Gleason 4+3=7, volume 70 mL  . PSA elevation   . Urinary retention     Past Surgical History:  Procedure Laterality Date  . APPENDECTOMY    . COLONOSCOPY  2004, 03/2008, 06/15/2011, 06/2016   2004 - 6 mm polyp, 2009 4 adenomas max 10 mm 2012 - no polyps 2017 diminutive adenoma  . CYSTOSCOPY  2010   with fulgeration  . CYSTOSCOPY  2006    w/ stone removal  . CYSTOSCOPY N/A 04/15/2014   Procedure: CYSTOSCOPY , WITH PROSTATE  BIOPSY;  Surgeon: Malka So, MD;  Location: [facility];  Service: Urology;  Laterality: N/A;  . INGUINAL HERNIA REPAIR  2011   left  . LITHOTRIPSY  2008   kidney stone  . LYMPHADENECTOMY Bilateral 06/14/2014   Procedure: LYMPHADENECTOMY;  Surgeon: Raynelle Bring, MD;  Location: [facility];  Service: Urology;  Laterality: Bilateral;  . PROSTATE BIOPSY  01/2007   benign, TURP  . PROSTATE BIOPSY N/A 04/15/2014   Procedure: PROSTATE ULTRASOUND/BIOPSY ;  Surgeon: Malka So, MD;  Location: [facility];  Service: Urology;  Laterality: N/A;  . PROSTATE SURGERY    . ROBOT ASSISTED LAPAROSCOPIC RADICAL PROSTATECTOMY N/A 06/14/2014   Procedure: ROBOTIC ASSISTED LAPAROSCOPIC RADICAL PROSTATECTOMY LEVEL 3;  Surgeon: Raynelle Bring, MD;  Location: [facility];  Service: Urology;  Laterality: N/A;  . TONSILLECTOMY    . TRANSURETHRAL RESECTION OF PROSTATE  2007    Current Outpatient Prescriptions on File Prior to Visit  Medication Sig Dispense Refill  . benazepril-hydrochlorthiazide (LOTENSIN HCT) 10-12.5 MG tablet TAKE 1 TABLET BY MOUTH EVERY DAY TO CONTROL BLOOD PRESSURE    . Multiple Vitamins-Minerals (MULTIVITAMIN ADULT PO) Take by mouth.     No current facility-administered medications on file prior to visit.     No Known Allergies  Family History  Problem Relation Age of Onset  . Breast cancer Mother 55  . Dementia Mother   . Transient ischemic attack Mother   .  Prostate cancer Father 67    prostate  . Healthy Sister   . Healthy Brother   . Colon cancer Neg Hx     Social History   Social History  . Marital status: Single    Spouse name: N/A  . Number of children: N/A  . Years of education: N/A   Occupational History  . Not on file.   Social History Main Topics  . Smoking status: Never Smoker  . Smokeless tobacco: Never Used  . Alcohol use No  . Drug use: No  . Sexual activity: Yes   Other Topics Concern  . Not on file   Social History Narrative  . No narrative on file   Review of Systems  Eyes: Negative for blurred vision.  Respiratory: Negative for cough and shortness of breath.   Cardiovascular: Negative for chest pain and palpitations.  Gastrointestinal: Negative for abdominal pain, blood in stool, heartburn, melena, nausea and vomiting.  Skin: Positive for rash.  Neurological: Negative for dizziness and loss of consciousness.  Endo/Heme/Allergies: Does not bruise/bleed easily.   BP 132/80   Pulse 87   Temp 98.3 F (36.8 C) (Oral)   Resp 14   Ht 5\' 11"  (1.803 m)   Wt 225 lb (102.1 kg)   SpO2 95%   BMI 31.38 kg/m   Physical Exam  Constitutional: He is oriented to person, place, and time and well-developed, well-nourished, and in no distress.  HENT:  Head: Normocephalic and atraumatic.  Eyes: Conjunctivae are normal. Pupils are equal, round, and reactive to light.  Neck: Neck supple. No thyromegaly present.  Cardiovascular: Normal rate, regular rhythm, normal heart sounds and intact distal pulses.   Pulmonary/Chest: Effort normal and breath sounds normal. No respiratory distress. He has no wheezes. He has no rales. He exhibits no tenderness.  Neurological: He is alert and oriented to person, place, and time.  Skin: Skin is warm and dry.     Psychiatric: Affect normal.  Vitals reviewed.  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  Discussed supportive measures. Rx Lotrisone. Instructions for application reviewed. Avoid scratching area. If not resolving with this regimen, will need Dermatology assessment.   Hypertension BP controlled. Asymptomatic. Will update labs today as patient is fasting. Continue current regimen.   Hx of adenomatous polyp of colon Followed by Gastroenterology Carlean Purl). Repeat colonoscopy in 2022.  History of prostate cancer s/p radical prostatectomy in 2015.  Followed by [facility] (Dr. Alinda Money)    Hassell Done, Sandria Manly, PA-C

## 2016-11-27 NOTE — Patient Instructions (Signed)
Please go to the lab for blood work. I will call you with your results.  Please continue blood pressure medications as directed. Work on increasing exercise to 150 minutes per week. Keep up with the dietary changes.   Apply the lotion to the rash twice daily for 2 weeks.  If not resolving, we will need a Dermatology assessment.   Please schedule an appointment for a physical in June when due.   Welcome to [facility]!

## 2016-11-27 NOTE — Assessment & Plan Note (Signed)
Followed by Gastroenterology Carlean Purl). Repeat colonoscopy in 2022.

## 2016-11-27 NOTE — Assessment & Plan Note (Signed)
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 Discussed supportive measures. Rx Lotrisone. Instructions for application reviewed. Avoid scratching area. If not resolving with this regimen, will need Dermatology assessment.

## 2016-11-30 ENCOUNTER — Other Ambulatory Visit: Payer: Self-pay | Admitting: Physician Assistant

## 2016-11-30 DIAGNOSIS — E119 Type 2 diabetes mellitus without complications: Secondary | ICD-10-CM

## 2016-11-30 DIAGNOSIS — E785 Hyperlipidemia, unspecified: Secondary | ICD-10-CM

## 2016-11-30 MED ORDER — ATORVASTATIN CALCIUM 10 MG PO TABS: 10.0000 mg | ORAL_TABLET | Freq: Every day | ORAL | 1 refills | Status: DC

## 2016-12-06 LAB — HM DIABETES EYE EXAM

## 2016-12-28 ENCOUNTER — Other Ambulatory Visit: Payer: Self-pay | Admitting: General Practice

## 2016-12-28 DIAGNOSIS — E785 Hyperlipidemia, unspecified: Secondary | ICD-10-CM

## 2016-12-28 MED ORDER — ATORVASTATIN CALCIUM 10 MG PO TABS: 10.0000 mg | ORAL_TABLET | Freq: Every day | ORAL | 0 refills | Status: DC

## 2017-01-21 ENCOUNTER — Ambulatory Visit (INDEPENDENT_AMBULATORY_CARE_PROVIDER_SITE_OTHER): Payer: BLUE CROSS/BLUE SHIELD | Admitting: Physician Assistant

## 2017-01-21 ENCOUNTER — Encounter: Payer: Self-pay | Admitting: Physician Assistant

## 2017-01-21 VITALS — BP 130/86 | HR 74 | Temp 98.4°F | Resp 14 | Ht 71.0 in | Wt 223.0 lb

## 2017-01-21 DIAGNOSIS — E785 Hyperlipidemia, unspecified: Secondary | ICD-10-CM | POA: Diagnosis not present

## 2017-01-21 DIAGNOSIS — R7303 Prediabetes: Secondary | ICD-10-CM | POA: Insufficient documentation

## 2017-01-21 DIAGNOSIS — E119 Type 2 diabetes mellitus without complications: Secondary | ICD-10-CM | POA: Diagnosis not present

## 2017-01-21 DIAGNOSIS — I10 Essential (primary) hypertension: Secondary | ICD-10-CM

## 2017-01-21 DIAGNOSIS — Z0000 Encounter for general adult medical examination without abnormal findings: Secondary | ICD-10-CM

## 2017-01-21 LAB — COMPREHENSIVE METABOLIC PANEL
ALK PHOS: 75 U/L (ref 39–117)
ALT: 27 U/L (ref 0–53)
AST: 23 U/L (ref 0–37)
Albumin: 4.2 g/dL (ref 3.5–5.2)
BILIRUBIN TOTAL: 0.5 mg/dL (ref 0.2–1.2)
BUN: 14 mg/dL (ref 6–23)
CALCIUM: 9.3 mg/dL (ref 8.4–10.5)
CO2: 28 mEq/L (ref 19–32)
Chloride: 105 mEq/L (ref 96–112)
Creatinine, Ser: 0.8 mg/dL (ref 0.40–1.50)
GFR: 124.83 mL/min (ref >60.00)
Glucose, Bld: 121 mg/dL — ABNORMAL HIGH (ref 70–99)
Potassium: 3.7 mEq/L (ref 3.5–5.1)
Sodium: 139 mEq/L (ref 135–145)
TOTAL PROTEIN: 6.4 g/dL (ref 6.0–8.3)

## 2017-01-21 LAB — LIPID PANEL
Cholesterol: 127 mg/dL (ref 0–200)
HDL: 40.9 mg/dL (ref >39.00)
LDL CALC: 73 mg/dL (ref 0–99)
NonHDL: 86.11
Total CHOL/HDL Ratio: 3
Triglycerides: 68 mg/dL (ref 0.0–149.0)
VLDL: 13.6 mg/dL (ref 0.0–40.0)

## 2017-01-21 LAB — CBC
HCT: 40.9 % (ref 39.0–52.0)
Hemoglobin: 13.2 g/dL (ref 13.0–17.0)
MCHC: 32.2 g/dL (ref 30.0–36.0)
MCV: 80.9 fl (ref 78.0–100.0)
PLATELETS: 323 10*3/uL (ref 150.0–400.0)
RBC: 5.06 Mil/uL (ref 4.22–5.81)
RDW: 18.5 % — ABNORMAL HIGH (ref 11.5–15.5)
WBC: 9.3 10*3/uL (ref 4.0–10.5)

## 2017-01-21 LAB — URINALYSIS, ROUTINE W REFLEX MICROSCOPIC
Bilirubin Urine: NEGATIVE
Hgb urine dipstick: NEGATIVE
KETONES UR: NEGATIVE
LEUKOCYTES UA: NEGATIVE
NITRITE: NEGATIVE
RBC / HPF: NONE SEEN (ref 0–?)
SPECIFIC GRAVITY, URINE: 1.015 (ref 1.000–1.030)
Total Protein, Urine: NEGATIVE
UROBILINOGEN UA: 0.2 (ref 0.0–1.0)
Urine Glucose: NEGATIVE
pH: 6 (ref 5.0–8.0)

## 2017-01-21 NOTE — Addendum Note (Signed)
Addended by: Katina Dung on: 01/21/2017 09:20 AM   Modules accepted: Orders

## 2017-01-21 NOTE — Progress Notes (Signed)
Patient presents to clinic today for annual exam.  Patient is fasting for labs.  Acute Concerns: Denies acute concerns today.  Chronic Issues: Diabetes Mellitus II -- new onset. A1C at last visit at 6.6. Nutritionist referral was placed but patent did not schedule an appointment. Has worked hard on diet -- switching to only lean protein that is baked, broiled or grilled. Plenty of vegetables. Is cutting back on sweets. Drinking plenty of water. Has started riding bicycle -- Is doing 3 x weekly. Has already lost a few pounds.   Hyperlipidemia -- Started on Lipitor 10 mg daily at last visit. Is taking as directed. Lifestyle changes as above. Denies side effect. Is taking 81 mg daily. Is due for repeat labs.   Hypertension -- Is currently on a   History of Prostate Cancer -- Followed by Dr. Alinda Money. S/p Prostatectomy in 2016. Follow-up every 6 months at present. Appt scheduled for July.  Health Maintenance: Colonoscopy -- up-to-date  Past Medical History:  Diagnosis Date  . Arthritis    lower back  . BPH (benign prostatic hyperplasia)   . Erectile dysfunction   . Hematuria   . Hypertension   . Lumbar disc disease   . Nephrolithiasis   . Personal history of colonic polyps 2004, 2009, 2017   adenomas  . Prostate cancer ([place]) 04/15/14   Gleason 4+3=7, volume 70 mL  . PSA elevation   . Urinary retention     Past Surgical History:  Procedure Laterality Date  . APPENDECTOMY    . COLONOSCOPY  2004, 03/2008, 06/15/2011, 06/2016   2004 - 6 mm polyp, 2009 4 adenomas max 10 mm 2012 - no polyps 2017 diminutive adenoma  . CYSTOSCOPY  2010   with fulgeration  . CYSTOSCOPY  2006    w/ stone removal  . CYSTOSCOPY N/A 04/15/2014   Procedure: CYSTOSCOPY , WITH PROSTATE  BIOPSY;  Surgeon: Malka So, MD;  [facility];  Service: Urology;  Laterality: N/A;  . INGUINAL HERNIA REPAIR  2011   left  . LITHOTRIPSY  2008   kidney stone  . LYMPHADENECTOMY Bilateral  06/14/2014   Procedure: LYMPHADENECTOMY;  Surgeon: Raynelle Bring, MD;  Location: [facility];  Service: Urology;  Laterality: Bilateral;  . PROSTATE BIOPSY  01/2007   benign, TURP  . PROSTATE BIOPSY N/A 04/15/2014   Procedure: PROSTATE ULTRASOUND/BIOPSY ;  Surgeon: Malka So, MD;  Location: [facility];  Service: Urology;  Laterality: N/A;  . PROSTATE SURGERY    . ROBOT ASSISTED LAPAROSCOPIC RADICAL PROSTATECTOMY N/A 06/14/2014   Procedure: ROBOTIC ASSISTED LAPAROSCOPIC RADICAL PROSTATECTOMY LEVEL 3;  Surgeon: Raynelle Bring, MD;  Location: [facility];  Service: Urology;  Laterality: N/A;  . TONSILLECTOMY    . TRANSURETHRAL RESECTION OF PROSTATE  2007    Current Outpatient Prescriptions on File Prior to Visit  Medication Sig Dispense Refill  . aspirin 81 MG tablet Take 81 mg by mouth daily.    Marland Kitchen atorvastatin (LIPITOR) 10 MG tablet Take 1 tablet (10 mg total) by mouth daily. 90 tablet 0  . benazepril-hydrochlorthiazide (LOTENSIN HCT) 10-12.5 MG tablet TAKE 1 TABLET BY MOUTH EVERY DAY TO CONTROL BLOOD PRESSURE    . clotrimazole-betamethasone (LOTRISONE) cream Apply 1 application topically 2 (two) times daily. 30 g 0  . Multiple Vitamins-Minerals (MULTIVITAMIN ADULT PO) Take by mouth.     No current facility-administered medications on file prior to visit.     No Known Allergies  Family History  Problem Relation Age of Onset  . Breast cancer Mother 19  . Dementia Mother   . Transient ischemic attack Mother   . Prostate cancer Father 13       prostate  . Healthy Sister   . Healthy Brother   . Colon cancer Neg Hx     Social History   Social History  . Marital status: Single    Spouse name: N/A  . Number of children: N/A  . Years of education: N/A   Occupational History  . Not on file.   Social History Main Topics  . Smoking status: Never Smoker  . Smokeless tobacco: Never Used  . Alcohol use No  . Drug use: No  . Sexual activity: Yes   Other Topics Concern  .  Not on file   Social History Narrative  . No narrative on file    Review of Systems  Constitutional: Negative for fever and weight loss.  HENT: Negative for ear discharge, ear pain, hearing loss and tinnitus.   Eyes: Negative for blurred vision, double vision, photophobia and pain.  Respiratory: Negative for cough and shortness of breath.   Cardiovascular: Negative for chest pain and palpitations.  Gastrointestinal: Negative for abdominal pain, blood in stool, constipation, diarrhea, heartburn, melena, nausea and vomiting.  Genitourinary: Negative for dysuria, flank pain, frequency, hematuria and urgency.  Musculoskeletal: Negative for falls.  Neurological: Negative for dizziness, loss of consciousness and headaches.  Endo/Heme/Allergies: Negative for environmental allergies.  Psychiatric/Behavioral: Negative for depression, hallucinations, substance abuse and suicidal ideas. The patient is not nervous/anxious and does not have insomnia.     BP 130/86   Pulse 74   Temp 98.4 F (36.9 C) (Oral)   Resp 14   Ht 5\' 11"  (1.803 m)   Wt 223 lb (101.2 kg)   SpO2 97%   BMI 31.10 kg/m   Physical Exam  Constitutional: He is oriented to person, place, and time and well-developed, well-nourished, and in no distress.  HENT:  Head: Normocephalic and atraumatic.  Right Ear: External ear normal.  Left Ear: External ear normal.  Nose: Nose normal.  Mouth/Throat: Oropharynx is clear and moist. No oropharyngeal exudate.  Eyes: Conjunctivae and EOM are normal. Pupils are equal, round, and reactive to light.  Neck: Neck supple. No thyromegaly present.  Cardiovascular: Normal rate, regular rhythm, normal heart sounds and intact distal pulses.   Pulmonary/Chest: Effort normal and breath sounds normal. No respiratory distress. He has no wheezes. He has no rales. He exhibits no tenderness.  Abdominal: Soft. Bowel sounds are normal. He exhibits no distension and no mass. There is no tenderness. There  is no rebound and no guarding.  Genitourinary: Testes/scrotum normal.  Genitourinary Comments: Patient defers to Urology  Lymphadenopathy:    He has no cervical adenopathy.  Neurological: He is alert and oriented to person, place, and time.  Skin: Skin is warm and dry. No rash noted.  Psychiatric: Affect normal.  Vitals reviewed.   Recent Results (from the past 2160 hour(s))  Comprehensive metabolic panel     Status: Abnormal   Collection Time: 11/27/16  9:10 AM  Result Value Ref Range   Sodium 138 135 - 145 mEq/L   Potassium 4.2 3.5 - 5.1 mEq/L   Chloride 103 96 - 112 mEq/L   CO2 28 19 - 32 mEq/L   Glucose, Bld 111 (H) 70 - 99 mg/dL   BUN 11 6 - 23 mg/dL   Creatinine, Ser 0.91 0.40 - 1.50 mg/dL  Total Bilirubin 0.6 0.2 - 1.2 mg/dL   Alkaline Phosphatase 67 39 - 117 U/L   AST 20 0 - 37 U/L   ALT 24 0 - 53 U/L   Total Protein 6.8 6.0 - 8.3 g/dL   Albumin 4.5 3.5 - 5.2 g/dL   Calcium 9.5 8.4 - 10.5 mg/dL   GFR 107.63 >60.00 mL/min  Hemoglobin A1c     Status: Abnormal   Collection Time: 11/27/16  9:10 AM  Result Value Ref Range   Hgb A1c MFr Bld 6.6 (H) 4.6 - 6.5 %    Comment: Glycemic Control Guidelines for People with Diabetes:Non Diabetic:  <6%Goal of Therapy: <7%Additional Action Suggested:  >8%   Lipid panel     Status: Abnormal   Collection Time: 11/27/16  9:10 AM  Result Value Ref Range   Cholesterol 185 0 - 200 mg/dL    Comment: ATP III Classification       Desirable:  < 200 mg/dL               Borderline High:  200 - 239 mg/dL          High:  > = 240 mg/dL   Triglycerides 111.0 0.0 - 149.0 mg/dL    Comment: Normal:  <150 mg/dLBorderline High:  150 - 199 mg/dL   HDL 45.90 >39.00 mg/dL   VLDL 22.2 0.0 - 40.0 mg/dL   LDL Cholesterol 117 (H) 0 - 99 mg/dL   Total CHOL/HDL Ratio 4     Comment:                Men          Women1/2 Average Risk     3.4          3.3Average Risk          5.0          4.42X Average Risk          9.6          7.13X Average Risk          15.0           11.0                       NonHDL 139.06     Comment: NOTE:  Non-HDL goal should be 30 mg/dL higher than patient's LDL goal (i.e. LDL goal of < 70 mg/dL, would have non-HDL goal of < 100 mg/dL)  HM DIABETES EYE EXAM     Status: None   Collection Time: 12/06/16 12:00 AM  Result Value Ref Range   HM Diabetic Eye Exam No Retinopathy No Retinopathy   Assessment/Plan: Visit for preventive health examination Depression screen negative. Health Maintenance reviewed. Preventive schedule discussed and handout given in AVS. Will obtain fasting labs today.   Hypertension BP well-controlled. Asymptomatic. Continue current regimen. Labs today.  Hyperlipidemia Taking medication as directed. Has made great strides with lifestyle changes. Will repeat lipid and lfts today.  Diet-controlled diabetes mellitus ([place]) Recent diagnosis.  A1C at 6.6. Diabetic foot examination normal. Labs today.     Leeanne Rio, PA-C

## 2017-01-21 NOTE — Progress Notes (Signed)
Pre visit review using our clinic review tool, if applicable. No additional management support is needed unless otherwise documented below in the visit note. 

## 2017-01-21 NOTE — Addendum Note (Signed)
Addended by: Katina Dung on: 01/21/2017 09:21 AM   Modules accepted: Orders

## 2017-01-21 NOTE — Assessment & Plan Note (Signed)
Depression screen negative. Health Maintenance reviewed. Preventive schedule discussed and handout given in AVS. Will obtain fasting labs today.  

## 2017-01-21 NOTE — Assessment & Plan Note (Signed)
BP well-controlled. Asymptomatic. Continue current regimen. Labs today.

## 2017-01-21 NOTE — Patient Instructions (Signed)
Please go to the lab for blood work.   Our office will call you with your results unless you have chosen to receive results via MyChart.  If your blood work is normal we will follow-up each year for physicals and as scheduled for chronic medical problems.  If anything is abnormal we will treat accordingly and get you in for a follow-up.  Continue medications as directed. I am impressed with all of the lifestyle changes you have made! Keep up the good work!  Follow-up for repeat A1C in August.  Follow-up with me in 6 months.    Preventive Care 40-64 Years, Male Preventive care refers to lifestyle choices and visits with your health care provider that can promote health and wellness. What does preventive care include?  A yearly physical exam. This is also called an annual well check.  Dental exams once or twice a year.  Routine eye exams. Ask your health care provider how often you should have your eyes checked.  Personal lifestyle choices, including: ? Daily care of your teeth and gums. ? Regular physical activity. ? Eating a healthy diet. ? Avoiding tobacco and drug use. ? Limiting alcohol use. ? Practicing safe sex. ? Taking low-dose aspirin every day starting at age 16. What happens during an annual well check? The services and screenings done by your health care provider during your annual well check will depend on your age, overall health, lifestyle risk factors, and family history of disease. Counseling Your health care provider may ask you questions about your:  Alcohol use.  Tobacco use.  Drug use.  Emotional well-being.  Home and relationship well-being.  Sexual activity.  Eating habits.  Work and work Statistician.  Screening You may have the following tests or measurements:  Height, weight, and BMI.  Blood pressure.  Lipid and cholesterol levels. These may be checked every 5 years, or more frequently if you are over 63 years old.  Skin  check.  Lung cancer screening. You may have this screening every year starting at age 33 if you have a 30-pack-year history of smoking and currently smoke or have quit within the past 15 years.  Fecal occult blood test (FOBT) of the stool. You may have this test every year starting at age 92.  Flexible sigmoidoscopy or colonoscopy. You may have a sigmoidoscopy every 5 years or a colonoscopy every 10 years starting at age 76.  Prostate cancer screening. Recommendations will vary depending on your family history and other risks.  Hepatitis C blood test.  Hepatitis B blood test.  Sexually transmitted disease (STD) testing.  Diabetes screening. This is done by checking your blood sugar (glucose) after you have not eaten for a while (fasting). You may have this done every 1-3 years.  Discuss your test results, treatment options, and if necessary, the need for more tests with your health care provider. Vaccines Your health care provider may recommend certain vaccines, such as:  Influenza vaccine. This is recommended every year.  Tetanus, diphtheria, and acellular pertussis (Tdap, Td) vaccine. You may need a Td booster every 10 years.  Varicella vaccine. You may need this if you have not been vaccinated.  Zoster vaccine. You may need this after age 68.  Measles, mumps, and rubella (MMR) vaccine. You may need at least one dose of MMR if you were born in 1957 or later. You may also need a second dose.  Pneumococcal 13-valent conjugate (PCV13) vaccine. You may need this if you have certain conditions and  have not been vaccinated.  Pneumococcal polysaccharide (PPSV23) vaccine. You may need one or two doses if you smoke cigarettes or if you have certain conditions.  Meningococcal vaccine. You may need this if you have certain conditions.  Hepatitis A vaccine. You may need this if you have certain conditions or if you travel or work in places where you may be exposed to hepatitis  A.  Hepatitis B vaccine. You may need this if you have certain conditions or if you travel or work in places where you may be exposed to hepatitis B.  Haemophilus influenzae type b (Hib) vaccine. You may need this if you have certain risk factors.  Talk to your health care provider about which screenings and vaccines you need and how often you need them. This information is not intended to replace advice given to you by your health care provider. Make sure you discuss any questions you have with your health care provider. Document Released: 08/19/2015 Document Revised: 04/11/2016 Document Reviewed: 05/24/2015 Elsevier Interactive Patient Education  2017 [employer].

## 2017-01-21 NOTE — Assessment & Plan Note (Signed)
Taking medication as directed. Has made great strides with lifestyle changes. Will repeat lipid and lfts today.

## 2017-01-21 NOTE — Assessment & Plan Note (Signed)
Recent diagnosis.  A1C at 6.6. Diabetic foot examination normal. Labs today.

## 2017-04-20 ENCOUNTER — Other Ambulatory Visit: Payer: Self-pay | Admitting: Family Medicine

## 2017-04-20 DIAGNOSIS — E785 Hyperlipidemia, unspecified: Secondary | ICD-10-CM

## 2017-05-02 ENCOUNTER — Other Ambulatory Visit: Payer: Self-pay | Admitting: Emergency Medicine

## 2017-05-02 ENCOUNTER — Telehealth: Payer: Self-pay | Admitting: Physician Assistant

## 2017-05-02 DIAGNOSIS — I10 Essential (primary) hypertension: Secondary | ICD-10-CM

## 2017-05-02 MED ORDER — BENAZEPRIL-HYDROCHLOROTHIAZIDE 10-12.5 MG PO TABS: ORAL_TABLET | ORAL | 1 refills | Status: DC

## 2017-05-02 NOTE — Telephone Encounter (Signed)
Rx sent to the preferred patient pharmacy  

## 2017-05-02 NOTE — Telephone Encounter (Signed)
Pharmacy states pt needs a refill on benazeril 90 day supply.

## 2017-08-15 ENCOUNTER — Encounter: Payer: Self-pay | Admitting: Physician Assistant

## 2017-08-15 ENCOUNTER — Ambulatory Visit (INDEPENDENT_AMBULATORY_CARE_PROVIDER_SITE_OTHER): Payer: PPO | Admitting: Physician Assistant

## 2017-08-15 VITALS — BP 132/82 | HR 75 | Temp 97.9°F | Resp 14 | Ht 71.0 in | Wt 222.0 lb

## 2017-08-15 DIAGNOSIS — E119 Type 2 diabetes mellitus without complications: Secondary | ICD-10-CM | POA: Diagnosis not present

## 2017-08-15 DIAGNOSIS — E785 Hyperlipidemia, unspecified: Secondary | ICD-10-CM

## 2017-08-15 DIAGNOSIS — I10 Essential (primary) hypertension: Secondary | ICD-10-CM | POA: Diagnosis not present

## 2017-08-15 DIAGNOSIS — Z23 Encounter for immunization: Secondary | ICD-10-CM | POA: Diagnosis not present

## 2017-08-15 LAB — LIPID PANEL
CHOLESTEROL: 133 mg/dL (ref 0–200)
HDL: 40.9 mg/dL (ref >39.00)
LDL CALC: 78 mg/dL (ref 0–99)
NonHDL: 92.55
Total CHOL/HDL Ratio: 3
Triglycerides: 73 mg/dL (ref 0.0–149.0)
VLDL: 14.6 mg/dL (ref 0.0–40.0)

## 2017-08-15 LAB — COMPREHENSIVE METABOLIC PANEL
ALBUMIN: 4.3 g/dL (ref 3.5–5.2)
ALT: 21 U/L (ref 0–53)
AST: 23 U/L (ref 0–37)
Alkaline Phosphatase: 61 U/L (ref 39–117)
BUN: 13 mg/dL (ref 6–23)
CHLORIDE: 104 meq/L (ref 96–112)
CO2: 30 meq/L (ref 19–32)
Calcium: 9.2 mg/dL (ref 8.4–10.5)
Creatinine, Ser: 0.86 mg/dL (ref 0.40–1.50)
GFR: 114.63 mL/min (ref >60.00)
Glucose, Bld: 108 mg/dL — ABNORMAL HIGH (ref 70–99)
POTASSIUM: 3.7 meq/L (ref 3.5–5.1)
SODIUM: 141 meq/L (ref 135–145)
Total Bilirubin: 0.7 mg/dL (ref 0.2–1.2)
Total Protein: 6.7 g/dL (ref 6.0–8.3)

## 2017-08-15 LAB — HEMOGLOBIN A1C: HEMOGLOBIN A1C: 6.5 % (ref 4.6–6.5)

## 2017-08-15 NOTE — Patient Instructions (Signed)
Please go to the lab today for blood work.  I will call you with your results. We will alter treatment regimen(s) if indicated by your results.   Please keep up with diet and exercise. You can get the new Shingrix vaccine as it is not live and we can forego immune status testing.  Think about this and we can send a prescription to the pharmacy.

## 2017-08-15 NOTE — Assessment & Plan Note (Signed)
Last A1C 6.6. Due for repeat today. Working further on diet and exercise. Prevnar updated today. Eye exam and Foot exam.

## 2017-08-15 NOTE — Assessment & Plan Note (Signed)
Fasting labs today to reassess.

## 2017-08-15 NOTE — Progress Notes (Signed)
History of Present Illness: Patient is a 67 y.o. male who presents to clinic today for follow-up of Diabetes Mellitus II, previously controlled.  Patient not currently on medication for Diabetes. Is diet and exercise controlled. Patient has cut out all sodas and most sweets. Is staying very active with work and riding his bicycle 2-3 x week. Patient currently on medication regimen of atorvastatin 10 mg for HLD and Benazepril-HCTZ for HTN.  Is taking medications as directed. Endorses tolerating well without side effect.  Patient denies chest pain, palpitations, lightheadedness, dizziness, vision changes or frequent headaches.   BP Readings from Last 3 Encounters:  08/15/17 132/82  01/21/17 130/86  11/27/16 132/80   Latest Maintenance: A1C --  Lab Results  Component Value Date   HGBA1C 6.6 (H) 11/27/2016   Diabetic Eye Exam -- up-to-date. Denies concerns today. Urine Microalbumin -- patient on ACE/ARB Foot Exam -- up-to-date. Denies concerns today.  Past Medical History:  Diagnosis Date  . Arthritis    lower back  . BPH (benign prostatic hyperplasia)   . Erectile dysfunction   . Hematuria   . Hypertension   . Lumbar disc disease   . Nephrolithiasis   . Personal history of colonic polyps 2004, 2009, 2017   adenomas  . 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 PSA elevation   . Urinary retention     Current Outpatient Medications on File Prior to Visit  Medication Sig Dispense Refill  . aspirin 81 MG tablet Take 81 mg by mouth daily.    Marland Kitchen atorvastatin (LIPITOR) 10 MG tablet TAKE 1 TABLET BY MOUTH EVERY DAY 90 tablet 0  . benazepril-hydrochlorthiazide (LOTENSIN HCT) 10-12.5 MG tablet TAKE 1 TABLET BY MOUTH EVERY DAY TO CONTROL BLOOD PRESSURE 90 tablet 1  . clotrimazole-betamethasone (LOTRISONE) cream Apply 1 application topically 2 (two) times daily. 30 g 0  . Coenzyme Q10 (COQ10) 400 MG CAPS Take 1 capsule by mouth daily.    . Multiple Vitamins-Minerals  (MULTIVITAMIN ADULT PO) Take by mouth.     No current facility-administered medications on file prior to visit.     No Known Allergies  Family History  Problem Relation Age of Onset  . Breast cancer Mother 3  . Dementia Mother   . Transient ischemic attack Mother   . Prostate cancer Father 75       prostate  . Healthy Sister   . Healthy Brother   . Colon cancer Neg Hx     Social History   Socioeconomic History  . Marital status: Single    Spouse name: None  . Number of children: None  . Years of education: None  . Highest education level: None  Social Needs  . Financial resource strain: None  . Food insecurity - worry: None  . Food insecurity - inability: None  . Transportation needs - medical: None  . Transportation needs - non-medical: None  Occupational History  . None  Tobacco Use  . Smoking status: Never Smoker  . Smokeless tobacco: Never Used  Substance and Sexual Activity  . Alcohol use: No  . Drug use: No  . Sexual activity: Yes  Other Topics Concern  . None  Social History Narrative  . None   Review of Systems: Pertinent ROS are listed in HPI  Physical Examination: BP 132/82   Pulse 75   Temp 97.9 F (36.6 C) (Oral)   Resp 14   Ht 5\' 11"  (1.856 m)   Wt  222 lb (100.7 kg)   SpO2 95%   BMI 30.96 kg/m  General appearance: alert, cooperative, appears stated age and no distress Lungs: clear to auscultation bilaterally Heart: regular rate and rhythm, S1, S2 normal, no murmur, click, rub or gallop Extremities: extremities normal, atraumatic, no cyanosis or edema Neurologic: Alert and oriented X 3, normal strength and tone. Normal symmetric reflexes. Normal coordination and gait  Assessment/Plan: Hypertension BP well-controlled. Asymptomatic. Continue current regimen, diet and exercise.  Diet-controlled diabetes mellitus (HCC) Last A1C 6.6. Due for repeat today. Working further on diet and exercise. Prevnar updated today. Eye exam and Foot  exam.   Hyperlipidemia Fasting labs today to reassess.

## 2017-08-15 NOTE — Assessment & Plan Note (Signed)
BP well-controlled. Asymptomatic. Continue current regimen, diet and exercise.

## 2017-08-29 ENCOUNTER — Other Ambulatory Visit: Payer: Self-pay | Admitting: Physician Assistant

## 2017-08-29 DIAGNOSIS — E785 Hyperlipidemia, unspecified: Secondary | ICD-10-CM

## 2017-08-30 DIAGNOSIS — Z8546 Personal history of malignant neoplasm of prostate: Secondary | ICD-10-CM | POA: Diagnosis not present

## 2017-09-06 DIAGNOSIS — Z8546 Personal history of malignant neoplasm of prostate: Secondary | ICD-10-CM | POA: Diagnosis not present

## 2017-09-06 DIAGNOSIS — N5201 Erectile dysfunction due to arterial insufficiency: Secondary | ICD-10-CM | POA: Diagnosis not present

## 2017-11-11 ENCOUNTER — Telehealth: Payer: Self-pay | Admitting: Emergency Medicine

## 2017-11-11 NOTE — Telephone Encounter (Signed)
Copied from [place]. Topic: Appointment Scheduling - Scheduling Inquiry for Clinic >> Nov 11, 2017  8:58 AM Thomas Stuart, NT wrote: Reason for CRM: Pt calling and would like to set up his AWV. Please call pt to schedule.

## 2017-11-12 NOTE — Telephone Encounter (Signed)
Pt has been scheduled.  °

## 2017-12-02 ENCOUNTER — Other Ambulatory Visit: Payer: Self-pay | Admitting: Physician Assistant

## 2017-12-02 DIAGNOSIS — I10 Essential (primary) hypertension: Secondary | ICD-10-CM

## 2018-01-22 ENCOUNTER — Other Ambulatory Visit: Payer: Self-pay

## 2018-01-22 ENCOUNTER — Encounter: Payer: Self-pay | Admitting: Physician Assistant

## 2018-01-22 ENCOUNTER — Ambulatory Visit (INDEPENDENT_AMBULATORY_CARE_PROVIDER_SITE_OTHER): Payer: PPO | Admitting: Physician Assistant

## 2018-01-22 VITALS — BP 124/82 | HR 74 | Temp 98.1°F | Resp 14 | Ht 71.0 in | Wt 224.0 lb

## 2018-01-22 DIAGNOSIS — Z0000 Encounter for general adult medical examination without abnormal findings: Secondary | ICD-10-CM | POA: Diagnosis not present

## 2018-01-22 DIAGNOSIS — I10 Essential (primary) hypertension: Secondary | ICD-10-CM

## 2018-01-22 DIAGNOSIS — E785 Hyperlipidemia, unspecified: Secondary | ICD-10-CM

## 2018-01-22 DIAGNOSIS — E119 Type 2 diabetes mellitus without complications: Secondary | ICD-10-CM

## 2018-01-22 DIAGNOSIS — Z8546 Personal history of malignant neoplasm of prostate: Secondary | ICD-10-CM | POA: Diagnosis not present

## 2018-01-22 LAB — LIPID PANEL
CHOL/HDL RATIO: 3
CHOLESTEROL: 120 mg/dL (ref 0–200)
HDL: 41.6 mg/dL (ref >39.00)
LDL Cholesterol: 60 mg/dL (ref 0–99)
NonHDL: 78.88
TRIGLYCERIDES: 96 mg/dL (ref 0.0–149.0)
VLDL: 19.2 mg/dL (ref 0.0–40.0)

## 2018-01-22 LAB — CBC WITH DIFFERENTIAL/PLATELET
Basophils Absolute: 0.1 10*3/uL (ref 0.0–0.1)
Basophils Relative: 1.1 % (ref 0.0–3.0)
EOS ABS: 0.1 10*3/uL (ref 0.0–0.7)
Eosinophils Relative: 1.4 % (ref 0.0–5.0)
HEMATOCRIT: 38.5 % — AB (ref 39.0–52.0)
HEMOGLOBIN: 12.4 g/dL — AB (ref 13.0–17.0)
LYMPHS PCT: 29.6 % (ref 12.0–46.0)
Lymphs Abs: 2.3 10*3/uL (ref 0.7–4.0)
MCHC: 32.2 g/dL (ref 30.0–36.0)
MCV: 76.4 fl — ABNORMAL LOW (ref 78.0–100.0)
MONOS PCT: 8 % (ref 3.0–12.0)
Monocytes Absolute: 0.6 10*3/uL (ref 0.1–1.0)
NEUTROS ABS: 4.6 10*3/uL (ref 1.4–7.7)
Neutrophils Relative %: 59.9 % (ref 43.0–77.0)
Platelets: 312 10*3/uL (ref 150.0–400.0)
RBC: 5.04 Mil/uL (ref 4.22–5.81)
RDW: 20.3 % — ABNORMAL HIGH (ref 11.5–15.5)
WBC: 7.7 10*3/uL (ref 4.0–10.5)

## 2018-01-22 LAB — COMPREHENSIVE METABOLIC PANEL
ALBUMIN: 4.2 g/dL (ref 3.5–5.2)
ALT: 21 U/L (ref 0–53)
AST: 19 U/L (ref 0–37)
Alkaline Phosphatase: 67 U/L (ref 39–117)
BILIRUBIN TOTAL: 0.7 mg/dL (ref 0.2–1.2)
BUN: 12 mg/dL (ref 6–23)
CALCIUM: 9.1 mg/dL (ref 8.4–10.5)
CO2: 28 mEq/L (ref 19–32)
Chloride: 104 mEq/L (ref 96–112)
Creatinine, Ser: 0.86 mg/dL (ref 0.40–1.50)
GFR: 114.47 mL/min (ref >60.00)
Glucose, Bld: 107 mg/dL — ABNORMAL HIGH (ref 70–99)
Potassium: 3.7 mEq/L (ref 3.5–5.1)
Sodium: 139 mEq/L (ref 135–145)
Total Protein: 6.5 g/dL (ref 6.0–8.3)

## 2018-01-22 LAB — TSH: TSH: 1.06 u[IU]/mL (ref 0.35–4.50)

## 2018-01-22 LAB — HEMOGLOBIN A1C: Hgb A1c MFr Bld: 6.5 % (ref 4.6–6.5)

## 2018-01-22 NOTE — Assessment & Plan Note (Signed)
S/p radical prostatectomy. Followed by Urology (Dr. Alinda Money). They are following his PSA.

## 2018-01-22 NOTE — Assessment & Plan Note (Signed)
Depression screen negative. Health Maintenance reviewed. Preventive schedule discussed and handout given in AVS. Will obtain fasting labs today.  

## 2018-01-22 NOTE — Assessment & Plan Note (Signed)
Patient due for repeat eye exam. He is to schedule. No history of retinopathy. Foot exam updated today. No abnormal findings. Immunizations are up-to-date. Is working harder on diet and exercise. Will repeat CMP and A1C today.

## 2018-01-22 NOTE — Progress Notes (Signed)
Patient presents to clinic today for annual exam.  Patient is fasting for labs.  Acute Concerns: Denies acute concerns at today's visit.   Chronic Issues: Hypertension -- Patient is currently on a regimen of benazepril-HCTZ 10mg . Is taking as directed. Patient denies chest pain, palpitations, lightheadedness, dizziness, vision changes or frequent headaches.  BP Readings from Last 3 Encounters:  01/22/18 124/82  08/15/17 132/82  01/21/17 130/86   Hyperlipidemia -- Is currently taking Atorvastatin 10 mg daily. . Is taking daily as directed. Tolerating well.   DM II -- Diet controlled. Is riding his bicycle several times per week. Is cutting out sodas and limiting sweets. Is trying to work harder on this as he notes sodas are his weakness.  Lab Results  Component Value Date   HGBA1C 6.5 08/15/2017    Health Maintenance: Immunizations -- up-to-date.  Colonoscopy -- up-to-date. 5-year recall. Due in 2022.   Past Medical History:  Diagnosis Date  . Arthritis    lower back  . BPH (benign prostatic hyperplasia)   . Erectile dysfunction   . Hematuria   . Hypertension   . Lumbar disc disease   . Nephrolithiasis   . Personal history of colonic polyps 2004, 2009, 2017   adenomas  . 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 PSA elevation   . Urinary retention     Past Surgical History:  Procedure Laterality Date  . APPENDECTOMY    . COLONOSCOPY  2004, 03/2008, 06/15/2011, 06/2016   2004 - 6 mm polyp, 2009 4 adenomas max 10 mm 2012 - no polyps 2017 diminutive adenoma  . CYSTOSCOPY  2010   with fulgeration  . CYSTOSCOPY  2006    w/ stone removal  . CYSTOSCOPY N/A 04/15/2014   Procedure: CYSTOSCOPY , WITH PROSTATE  BIOPSY;  Surgeon: Malka So, MD;  Location: [facility];  Service: Urology;  Laterality: N/A;  . INGUINAL HERNIA REPAIR  2011   left  . LITHOTRIPSY  2008   kidney stone  . LYMPHADENECTOMY Bilateral 06/14/2014   Procedure:  LYMPHADENECTOMY;  Surgeon: Raynelle Bring, MD;  Location: [facility];  Service: Urology;  Laterality: Bilateral;  . PROSTATE BIOPSY  01/2007   benign, TURP  . PROSTATE BIOPSY N/A 04/15/2014   Procedure: PROSTATE ULTRASOUND/BIOPSY ;  Surgeon: Malka So, MD;  Location: [facility];  Service: Urology;  Laterality: N/A;  . PROSTATE SURGERY    . ROBOT ASSISTED LAPAROSCOPIC RADICAL PROSTATECTOMY N/A 06/14/2014   Procedure: ROBOTIC ASSISTED LAPAROSCOPIC RADICAL PROSTATECTOMY LEVEL 3;  Surgeon: Raynelle Bring, MD;  Location: [facility];  Service: Urology;  Laterality: N/A;  . TONSILLECTOMY    . TRANSURETHRAL RESECTION OF PROSTATE  2007    Current Outpatient Medications on File Prior to Visit  Medication Sig Dispense Refill  . aspirin 81 MG tablet Take 81 mg by mouth once a week.     Marland Kitchen atorvastatin (LIPITOR) 10 MG tablet TAKE 1 TABLET BY MOUTH EVERY DAY 90 tablet 1  . benazepril-hydrochlorthiazide (LOTENSIN HCT) 10-12.5 MG tablet TAKE 1 TABLET BY MOUTH EVERY DAY TO CONTROL BLOOD PRESSURE 90 tablet 1  . clotrimazole-betamethasone (LOTRISONE) cream Apply 1 application topically 2 (two) times daily. 30 g 0  . Coenzyme Q10 (COQ10) 400 MG CAPS Take 1 capsule by mouth daily.    . Multiple Vitamins-Minerals (MULTIVITAMIN ADULT PO) Take by mouth.     No current facility-administered medications on file prior to visit.  No Known Allergies  Family History  Problem Relation Age of Onset  . Breast cancer Mother 83  . Dementia Mother   . Transient ischemic attack Mother   . Prostate cancer Father 87       prostate  . Healthy Sister   . Healthy Brother   . Colon cancer Neg Hx     Social History   Socioeconomic History  . Marital status: Single    Spouse name: Not on file  . Number of children: Not on file  . Years of education: Not on file  . Highest education level: Not on file  Occupational History  . Not on file  Social Needs  . Financial resource strain: Not on file  . Food  insecurity:    Worry: Not on file    Inability: Not on file  . Transportation needs:    Medical: Not on file    Non-medical: Not on file  Tobacco Use  . Smoking status: Never Smoker  . Smokeless tobacco: Never Used  Substance and Sexual Activity  . Alcohol use: No  . Drug use: No  . Sexual activity: Yes  Lifestyle  . Physical activity:    Days per week: Not on file    Minutes per session: Not on file  . Stress: Not on file  Relationships  . Social connections:    Talks on phone: Not on file    Gets together: Not on file    Attends religious service: Not on file    Active member of club or organization: Not on file    Attends meetings of clubs or organizations: Not on file    Relationship status: Not on file  . Intimate partner violence:    Fear of current or ex partner: Not on file    Emotionally abused: Not on file    Physically abused: Not on file    Forced sexual activity: Not on file  Other Topics Concern  . Not on file  Social History Narrative  . Not on file   Review of Systems  Constitutional: Negative for fever and weight loss.  HENT: Negative for ear discharge, ear pain, hearing loss and tinnitus.   Eyes: Negative for blurred vision, double vision, photophobia and pain.  Respiratory: Negative for cough and shortness of breath.   Cardiovascular: Negative for chest pain and palpitations.  Gastrointestinal: Negative for abdominal pain, blood in stool, constipation, diarrhea, heartburn, melena, nausea and vomiting.  Genitourinary: Negative for dysuria, flank pain, frequency, hematuria and urgency.  Musculoskeletal: Negative for falls.  Neurological: Negative for dizziness, loss of consciousness and headaches.  Endo/Heme/Allergies: Negative for environmental allergies.  Psychiatric/Behavioral: Negative for depression, hallucinations, substance abuse and suicidal ideas. The patient is not nervous/anxious and does not have insomnia.    BP 124/82   Pulse 74   Temp  98.1 F (36.7 C) (Oral)   Resp 14   Ht 5\' 11"  (1.803 m)   Wt 224 lb (101.6 kg)   SpO2 98%   BMI 31.24 kg/m   Physical Exam  Constitutional: He is oriented to person, place, and time. He appears well-developed and well-nourished. No distress.  HENT:  Head: Normocephalic and atraumatic.  Right Ear: Tympanic membrane, external ear and ear canal normal.  Left Ear: Tympanic membrane, external ear and ear canal normal.  Nose: Nose normal.  Mouth/Throat: Oropharynx is clear and moist and mucous membranes are normal. No posterior oropharyngeal edema or posterior oropharyngeal erythema.  Eyes: Pupils are equal, round,  and reactive to light. Conjunctivae are normal.  Neck: Neck supple. No thyromegaly present.  Cardiovascular: Normal rate, regular rhythm, normal heart sounds and intact distal pulses.  Pulmonary/Chest: Effort normal and breath sounds normal. No respiratory distress. He has no wheezes. He has no rales. He exhibits no tenderness.  Abdominal: Soft. Bowel sounds are normal. He exhibits no distension and no mass. There is no tenderness. There is no rebound and no guarding.  Lymphadenopathy:    He has no cervical adenopathy.  Neurological: He is alert and oriented to person, place, and time. No cranial nerve deficit.  Skin: Skin is warm and dry. No rash noted. He is not diaphoretic.  Psychiatric: He has a normal mood and affect.  Vitals reviewed.  Assessment/Plan: Visit for preventive health examination Depression screen negative. Health Maintenance reviewed. Preventive schedule discussed and handout given in AVS. Will obtain fasting labs today.   Hyperlipidemia Taking statin as directed. Is working hard on diet and exercise. Repeat fasting lipids and LFTs today.   History of prostate cancer S/p radical prostatectomy. Followed by Urology (Dr. Alinda Money). They are following his PSA.  Hypertension BP normotensive. Asymptomatic. Continue current regimen. Will check CMP  today.  Diet-controlled diabetes mellitus ([place]) Patient due for repeat eye exam. He is to schedule. No history of retinopathy. Foot exam updated today. No abnormal findings. Immunizations are up-to-date. Is working harder on diet and exercise. Will repeat CMP and A1C today.     Leeanne Rio, PA-C

## 2018-01-22 NOTE — Patient Instructions (Signed)
Please go to the lab for blood work.  Our office will call you with your results unless you have chosen to receive results via MyChart. If your blood work is normal we will follow-up each year for physicals and as scheduled for chronic medical problems. If anything is abnormal we will treat accordingly and get you in for a follow-up.  Please continue chronic medications as directed.   Preventive Care 66 Years and Older, Male Preventive care refers to lifestyle choices and visits with your health care provider that can promote health and wellness. What does preventive care include?  A yearly physical exam. This is also called an annual well check.  Dental exams once or twice a year.  Routine eye exams. Ask your health care provider how often you should have your eyes checked.  Personal lifestyle choices, including: ? Daily care of your teeth and gums. ? Regular physical activity. ? Eating a healthy diet. ? Avoiding tobacco and drug use. ? Limiting alcohol use. ? Practicing safe sex. ? Taking low doses of aspirin every day. ? Taking vitamin and mineral supplements as recommended by your health care provider. What happens during an annual well check? The services and screenings done by your health care provider during your annual well check will depend on your age, overall health, lifestyle risk factors, and family history of disease. Counseling Your health care provider may ask you questions about your:  Alcohol use.  Tobacco use.  Drug use.  Emotional well-being.  Home and relationship well-being.  Sexual activity.  Eating habits.  History of falls.  Memory and ability to understand (cognition).  Work and work Statistician.  Screening You may have the following tests or measurements:  Height, weight, and BMI.  Blood pressure.  Lipid and cholesterol levels. These may be checked every 5 years, or more frequently if you are over 29 years old.  Skin  check.  Lung cancer screening. You may have this screening every year starting at age 52 if you have a 30-pack-year history of smoking and currently smoke or have quit within the past 15 years.  Fecal occult blood test (FOBT) of the stool. You may have this test every year starting at age 58.  Flexible sigmoidoscopy or colonoscopy. You may have a sigmoidoscopy every 5 years or a colonoscopy every 10 years starting at age 24.  Prostate cancer screening. Recommendations will vary depending on your family history and other risks.  Hepatitis C blood test.  Hepatitis B blood test.  Sexually transmitted disease (STD) testing.  Diabetes screening. This is done by checking your blood sugar (glucose) after you have not eaten for a while (fasting). You may have this done every 1-3 years.  Abdominal aortic aneurysm (AAA) screening. You may need this if you are a current or former smoker.  Osteoporosis. You may be screened starting at age 25 if you are at high risk.  Talk with your health care provider about your test results, treatment options, and if necessary, the need for more tests. Vaccines Your health care provider may recommend certain vaccines, such as:  Influenza vaccine. This is recommended every year.  Tetanus, diphtheria, and acellular pertussis (Tdap, Td) vaccine. You may need a Td booster every 10 years.  Varicella vaccine. You may need this if you have not been vaccinated.  Zoster vaccine. You may need this after age 59.  Measles, mumps, and rubella (MMR) vaccine. You may need at least one dose of MMR if you were born in  1956 or later. You may also need a second dose.  Pneumococcal 13-valent conjugate (PCV13) vaccine. One dose is recommended after age 98.  Pneumococcal polysaccharide (PPSV23) vaccine. One dose is recommended after age 14.  Meningococcal vaccine. You may need this if you have certain conditions.  Hepatitis A vaccine. You may need this if you have certain  conditions or if you travel or work in places where you may be exposed to hepatitis A.  Hepatitis B vaccine. You may need this if you have certain conditions or if you travel or work in places where you may be exposed to hepatitis B.  Haemophilus influenzae type b (Hib) vaccine. You may need this if you have certain risk factors.  Talk to your health care provider about which screenings and vaccines you need and how often you need them. This information is not intended to replace advice given to you by your health care provider. Make sure you discuss any questions you have with your health care provider. Document Released: 08/19/2015 Document Revised: 04/11/2016 Document Reviewed: 05/24/2015 Elsevier Interactive Patient Education  [employer].

## 2018-01-22 NOTE — Assessment & Plan Note (Addendum)
BP normotensive. Asymptomatic. Continue current regimen. Will check CMP today.

## 2018-01-22 NOTE — Assessment & Plan Note (Signed)
Taking statin as directed. Is working hard on diet and exercise. Repeat fasting lipids and LFTs today.

## 2018-01-23 ENCOUNTER — Other Ambulatory Visit: Payer: Self-pay | Admitting: Physician Assistant

## 2018-01-23 DIAGNOSIS — E119 Type 2 diabetes mellitus without complications: Secondary | ICD-10-CM

## 2018-01-23 DIAGNOSIS — D582 Other hemoglobinopathies: Secondary | ICD-10-CM

## 2018-01-23 MED ORDER — CINNAMON 500 MG PO TABS: 1.0000 | ORAL_TABLET | Freq: Every day | ORAL | 0 refills | Status: DC

## 2018-02-05 ENCOUNTER — Other Ambulatory Visit (INDEPENDENT_AMBULATORY_CARE_PROVIDER_SITE_OTHER): Payer: PPO

## 2018-02-05 ENCOUNTER — Other Ambulatory Visit: Payer: Self-pay | Admitting: Emergency Medicine

## 2018-02-05 ENCOUNTER — Other Ambulatory Visit: Payer: Self-pay | Admitting: *Deleted

## 2018-02-05 DIAGNOSIS — R21 Rash and other nonspecific skin eruption: Secondary | ICD-10-CM

## 2018-02-05 DIAGNOSIS — D582 Other hemoglobinopathies: Secondary | ICD-10-CM | POA: Diagnosis not present

## 2018-02-05 DIAGNOSIS — H2513 Age-related nuclear cataract, bilateral: Secondary | ICD-10-CM | POA: Diagnosis not present

## 2018-02-05 LAB — CBC WITH DIFFERENTIAL/PLATELET
BASOS PCT: 0.5 % (ref 0.0–3.0)
Basophils Absolute: 0 10*3/uL (ref 0.0–0.1)
EOS PCT: 1.6 % (ref 0.0–5.0)
Eosinophils Absolute: 0.1 10*3/uL (ref 0.0–0.7)
HCT: 39.9 % (ref 39.0–52.0)
Hemoglobin: 12.7 g/dL — ABNORMAL LOW (ref 13.0–17.0)
LYMPHS PCT: 32.7 % (ref 12.0–46.0)
Lymphs Abs: 3 10*3/uL (ref 0.7–4.0)
MCHC: 31.9 g/dL (ref 30.0–36.0)
MCV: 77.1 fl — AB (ref 78.0–100.0)
MONOS PCT: 8.8 % (ref 3.0–12.0)
Monocytes Absolute: 0.8 10*3/uL (ref 0.1–1.0)
NEUTROS ABS: 5.2 10*3/uL (ref 1.4–7.7)
NEUTROS PCT: 56.4 % (ref 43.0–77.0)
PLATELETS: 319 10*3/uL (ref 150.0–400.0)
RBC: 5.17 Mil/uL (ref 4.22–5.81)
RDW: 20.6 % — ABNORMAL HIGH (ref 11.5–15.5)
WBC: 9.3 10*3/uL (ref 4.0–10.5)

## 2018-02-05 MED ORDER — CENTRUM PO CHEW: 1.0000 | CHEWABLE_TABLET | Freq: Every day | ORAL | 0 refills | Status: DC

## 2018-02-05 MED ORDER — CLOTRIMAZOLE-BETAMETHASONE 1-0.05 % EX CREA: 1.0000 "application " | TOPICAL_CREAM | Freq: Two times a day (BID) | CUTANEOUS | 0 refills | Status: DC

## 2018-04-08 ENCOUNTER — Other Ambulatory Visit: Payer: Self-pay | Admitting: Physician Assistant

## 2018-04-08 DIAGNOSIS — E785 Hyperlipidemia, unspecified: Secondary | ICD-10-CM

## 2018-04-16 ENCOUNTER — Ambulatory Visit: Payer: Self-pay | Admitting: Physician Assistant

## 2018-04-16 NOTE — Telephone Encounter (Signed)
Pt called because his iron is making his stools soft. He stated he is not having diarrhea but is not constipated. Pt given reassurance that his stool consistency is normal and that most people that take iron causes constipation.   Reason for Disposition . General information question, no triage required and triager able to answer question  Answer Assessment - Initial Assessment Questions 1. REASON FOR CALL or QUESTION: "What is your reason for calling today?" or "How can I best help you?" or "What question do you have that I can help answer?"   Pt called because his iron is making his stools soft. He stated he is not having diarrhea but is not constipated. Pt given reassurance that his stool consistency is normal and that most people that take iron causes constipation.  Protocols used: INFORMATION ONLY CALL-A-AH

## 2018-06-15 ENCOUNTER — Other Ambulatory Visit: Payer: Self-pay | Admitting: Physician Assistant

## 2018-06-15 DIAGNOSIS — I10 Essential (primary) hypertension: Secondary | ICD-10-CM

## 2018-09-09 ENCOUNTER — Other Ambulatory Visit: Payer: Self-pay | Admitting: Physician Assistant

## 2018-09-09 DIAGNOSIS — I10 Essential (primary) hypertension: Secondary | ICD-10-CM

## 2018-09-09 NOTE — Telephone Encounter (Signed)
Patient has appointment on 09/15/18 for follow. Will discuss changing medication at that time

## 2018-09-15 ENCOUNTER — Ambulatory Visit (INDEPENDENT_AMBULATORY_CARE_PROVIDER_SITE_OTHER): Payer: PPO | Admitting: Physician Assistant

## 2018-09-15 ENCOUNTER — Encounter: Payer: Self-pay | Admitting: Physician Assistant

## 2018-09-15 ENCOUNTER — Other Ambulatory Visit: Payer: Self-pay

## 2018-09-15 VITALS — BP 122/86 | HR 84 | Temp 97.7°F | Resp 16 | Ht 71.0 in | Wt 224.0 lb

## 2018-09-15 DIAGNOSIS — I10 Essential (primary) hypertension: Secondary | ICD-10-CM | POA: Diagnosis not present

## 2018-09-15 DIAGNOSIS — Z9189 Other specified personal risk factors, not elsewhere classified: Secondary | ICD-10-CM

## 2018-09-15 DIAGNOSIS — E785 Hyperlipidemia, unspecified: Secondary | ICD-10-CM | POA: Diagnosis not present

## 2018-09-15 DIAGNOSIS — E119 Type 2 diabetes mellitus without complications: Secondary | ICD-10-CM | POA: Diagnosis not present

## 2018-09-15 DIAGNOSIS — Z1159 Encounter for screening for other viral diseases: Secondary | ICD-10-CM | POA: Diagnosis not present

## 2018-09-15 DIAGNOSIS — G2581 Restless legs syndrome: Secondary | ICD-10-CM | POA: Diagnosis not present

## 2018-09-15 LAB — LIPID PANEL
Cholesterol: 143 mg/dL (ref 0–200)
HDL: 39 mg/dL — ABNORMAL LOW (ref >39.00)
LDL Cholesterol: 87 mg/dL (ref 0–99)
NonHDL: 104.35
Total CHOL/HDL Ratio: 4
Triglycerides: 88 mg/dL (ref 0.0–149.0)
VLDL: 17.6 mg/dL (ref 0.0–40.0)

## 2018-09-15 LAB — COMPREHENSIVE METABOLIC PANEL
ALT: 25 U/L (ref 0–53)
AST: 20 U/L (ref 0–37)
Albumin: 4.3 g/dL (ref 3.5–5.2)
Alkaline Phosphatase: 76 U/L (ref 39–117)
BUN: 10 mg/dL (ref 6–23)
CHLORIDE: 102 meq/L (ref 96–112)
CO2: 26 meq/L (ref 19–32)
CREATININE: 0.79 mg/dL (ref 0.40–1.50)
Calcium: 9.4 mg/dL (ref 8.4–10.5)
GFR: 118.56 mL/min (ref >60.00)
Glucose, Bld: 91 mg/dL (ref 70–99)
Potassium: 3.9 mEq/L (ref 3.5–5.1)
Sodium: 139 mEq/L (ref 135–145)
Total Bilirubin: 0.7 mg/dL (ref 0.2–1.2)
Total Protein: 6.6 g/dL (ref 6.0–8.3)

## 2018-09-15 LAB — HEMOGLOBIN A1C: Hgb A1c MFr Bld: 6.1 % (ref 4.6–6.5)

## 2018-09-15 LAB — FERRITIN: Ferritin: 34.5 ng/mL (ref 22.0–322.0)

## 2018-09-15 NOTE — Assessment & Plan Note (Signed)
Repeat fasting labs today. Continue medication and lifestyle changes.

## 2018-09-15 NOTE — Patient Instructions (Signed)
Please go to the lab today for blood work.  I will call you with your results. We will alter treatment regimen(s) if indicated by your results.    Heart-Healthy Eating Plan Heart-healthy meal planning includes:  Eating less unhealthy fats.  Eating more healthy fats.  Making other changes in your diet. Talk with your doctor or a diet specialist (dietitian) to create an eating plan that is right for you. What is my plan? Your doctor may recommend an eating plan that includes:  Total fat: ______% or less of total calories a day.  Saturated fat: ______% or less of total calories a day.  Cholesterol: less than _________mg a day. What are tips for following this plan? Cooking Avoid frying your food. Try to bake, boil, grill, or broil it instead. You can also reduce fat by:  Removing the skin from poultry.  Removing all visible fats from meats.  Steaming vegetables in water or broth. Meal planning   At meals, divide your plate into four equal parts: ? Fill one-half of your plate with vegetables and green salads. ? Fill one-fourth of your plate with whole grains. ? Fill one-fourth of your plate with lean protein foods.  Eat 4-5 servings of vegetables per day. A serving of vegetables is: ? 1 cup of raw or cooked vegetables. ? 2 cups of raw leafy greens.  Eat 4-5 servings of fruit per day. A serving of fruit is: ? 1 medium whole fruit. ?  cup of dried fruit. ?  cup of fresh, frozen, or canned fruit. ?  cup of 100% fruit juice.  Eat more foods that have soluble fiber. These are apples, broccoli, carrots, beans, peas, and barley. Try to get 20-30 g of fiber per day.  Eat 4-5 servings of nuts, legumes, and seeds per week: ? 1 serving of dried beans or legumes equals  cup after being cooked. ? 1 serving of nuts is  cup. ? 1 serving of seeds equals 1 tablespoon. General information  Eat more home-cooked food. Eat less restaurant, buffet, and fast food.  Limit or avoid  alcohol.  Limit foods that are high in starch and sugar.  Avoid fried foods.  Lose weight if you are overweight.  Keep track of how much salt (sodium) you eat. This is important if you have high blood pressure. Ask your doctor to tell you more about this.  Try to add vegetarian meals each week. Fats  Choose healthy fats. These include olive oil and canola oil, flaxseeds, walnuts, almonds, and seeds.  Eat more omega-3 fats. These include salmon, mackerel, sardines, tuna, flaxseed oil, and ground flaxseeds. Try to eat fish at least 2 times each week.  Check food labels. Avoid foods with trans fats or high amounts of saturated fat.  Limit saturated fats. ? These are often found in animal products, such as meats, butter, and cream. ? These are also found in plant foods, such as palm oil, palm kernel oil, and coconut oil.  Avoid foods with partially hydrogenated oils in them. These have trans fats. Examples are stick margarine, some tub margarines, cookies, crackers, and other baked goods. What foods can I eat? Fruits All fresh, canned (in natural juice), or frozen fruits. Vegetables Fresh or frozen vegetables (raw, steamed, roasted, or grilled). Green salads. Grains Most grains. Choose whole wheat and whole grains most of the time. Rice and pasta, including brown rice and pastas made with whole wheat. Meats and other proteins Lean, well-trimmed beef, veal, pork, and lamb.  Chicken and [place] without skin. All fish and shellfish. Wild duck, rabbit, pheasant, and venison. Egg whites or low-cholesterol egg substitutes. Dried beans, peas, lentils, and tofu. Seeds and most nuts. Dairy Low-fat or nonfat cheeses, including ricotta and mozzarella. Skim or 1% milk that is liquid, powdered, or evaporated. Buttermilk that is made with low-fat milk. Nonfat or low-fat yogurt. Fats and oils Non-hydrogenated (trans-free) margarines. Vegetable oils, including soybean, sesame, sunflower, olive, peanut,  safflower, corn, canola, and cottonseed. Salad dressings or mayonnaise made with a vegetable oil. Beverages Mineral water. Coffee and tea. Diet carbonated beverages. Sweets and desserts Sherbet, gelatin, and fruit ice. Small amounts of dark chocolate. Limit all sweets and desserts. Seasonings and condiments All seasonings and condiments. The items listed above may not be a complete list of foods and drinks you can eat. Contact a dietitian for more options. What foods should I avoid? Fruits Canned fruit in heavy syrup. Fruit in cream or butter sauce. Fried fruit. Limit coconut. Vegetables Vegetables cooked in cheese, cream, or butter sauce. Fried vegetables. Grains Breads that are made with saturated or trans fats, oils, or whole milk. Croissants. Sweet rolls. Donuts. High-fat crackers, such as cheese crackers. Meats and other proteins Fatty meats, such as hot dogs, ribs, sausage, bacon, rib-eye roast or steak. High-fat deli meats, such as salami and bologna. Caviar. Domestic duck and goose. Organ meats, such as liver. Dairy Cream, sour cream, cream cheese, and creamed cottage cheese. Whole-milk cheeses. Whole or 2% milk that is liquid, evaporated, or condensed. Whole buttermilk. Cream sauce or high-fat cheese sauce. Yogurt that is made from whole milk. Fats and oils Meat fat, or shortening. Cocoa butter, hydrogenated oils, palm oil, coconut oil, palm kernel oil. Solid fats and shortenings, including bacon fat, salt pork, lard, and butter. Nondairy cream substitutes. Salad dressings with cheese or sour cream. Beverages Regular sodas and juice drinks with added sugar. Sweets and desserts Frosting. Pudding. Cookies. Cakes. Pies. Milk chocolate or white chocolate. Buttered syrups. Full-fat ice cream or ice cream drinks. The items listed above may not be a complete list of foods and drinks to avoid. Contact a dietitian for more information. Summary  Heart-healthy meal planning includes eating  less unhealthy fats, eating more healthy fats, and making other changes in your diet.  Eat a balanced diet. This includes fruits and vegetables, low-fat or nonfat dairy, lean protein, nuts and legumes, whole grains, and heart-healthy oils and fats. This information is not intended to replace advice given to you by your health care provider. Make sure you discuss any questions you have with your health care provider. Document Released: 01/22/2012 Document Revised: 08/30/2017 Document Reviewed: 08/30/2017 Elsevier Interactive Patient Education  2019 [employer].

## 2018-09-15 NOTE — Progress Notes (Addendum)
History of Present Illness: Patient is a 68 y.o. male who presents to clinic today for follow-up of Diabetes Mellitus II, diet controlled, hypertension and hyperlipidemia.  Patient currently on medication regimen of atorvastatin 10 mg qd, lotensin-hct 10-12.5 mg daily.  Endorses taking medications as directed. Endorses keeping active, riding his bicycle 3 x weekly. Notes getting off track with diet since the holidays. Has been eating more junk food.  Denies polyuria, polydipsia, polyphagia. Is due for follow-up with ophthalmology. Patient denies chest pain, palpitations, lightheadedness, dizziness, vision changes or frequent headaches.   Latest Maintenance: A1C --  Lab Results  Component Value Date   HGBA1C 6.5 01/22/2018    Past Medical History:  Diagnosis Date  . Arthritis    lower back  . BPH (benign prostatic hyperplasia)   . Erectile dysfunction   . Hematuria   . Hypertension   . Lumbar disc disease   . Nephrolithiasis   . Personal history of colonic polyps 2004, 2009, 2017   adenomas  . 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 PSA elevation   . Urinary retention     Current Outpatient Medications on File Prior to Visit  Medication Sig Dispense Refill  . atorvastatin (LIPITOR) 10 MG tablet TAKE 1 TABLET BY MOUTH EVERY DAY 90 tablet 1  . Cinnamon 500 MG TABS Take 1 tablet (500 mg total) by mouth daily. 30 tablet 0  . Coenzyme Q10 (COQ10) 400 MG CAPS Take 1 capsule by mouth daily.    . Multiple Vitamins-Minerals (MULTIVITAMIN ADULT PO) Take by mouth.    . multivitamin-iron-minerals-folic acid (CENTRUM) chewable tablet Chew 1 tablet by mouth daily. 30 tablet 0  . benazepril-hydrochlorthiazide (LOTENSIN HCT) 10-12.5 MG tablet TAKE 1 TABLET BY MOUTH EVERY DAY TO CONTROL BLOOD PRESSURE 90 tablet 1   No current facility-administered medications on file prior to visit.     No Known Allergies  Family History  Problem Relation Age of Onset  . Breast  cancer Mother 27  . Dementia Mother   . Transient ischemic attack Mother   . Prostate cancer Father 45       prostate  . Healthy Sister   . Healthy Brother   . Colon cancer Neg Hx     Social History   Socioeconomic History  . Marital status: Single    Spouse name: Not on file  . Number of children: Not on file  . Years of education: Not on file  . Highest education level: Not on file  Occupational History  . Not on file  Social Needs  . Financial resource strain: Not on file  . Food insecurity:    Worry: Not on file    Inability: Not on file  . Transportation needs:    Medical: Not on file    Non-medical: Not on file  Tobacco Use  . Smoking status: Never Smoker  . Smokeless tobacco: Never Used  Substance and Sexual Activity  . Alcohol use: No  . Drug use: No  . Sexual activity: Yes  Lifestyle  . Physical activity:    Days per week: Not on file    Minutes per session: Not on file  . Stress: Not on file  Relationships  . Social connections:    Talks on phone: Not on file    Gets together: Not on file    Attends religious service: Not on file    Active member of club or organization: Not on file  Attends meetings of clubs or organizations: Not on file    Relationship status: Not on file  Other Topics Concern  . Not on file  Social History Narrative  . Not on file   Review of Systems: Pertinent ROS are listed in HPI  Physical Examination: BP 122/86   Pulse 84   Temp 97.7 F (36.5 C) (Oral)   Resp 16   Ht 5\' 11"  (1.803 m)   Wt 224 lb (101.6 kg)   SpO2 98%   BMI 31.24 kg/m  General appearance: alert, cooperative, appears stated age and no distress Head: Normocephalic, without obvious abnormality, atraumatic Lungs: clear to auscultation bilaterally Heart: regular rate and rhythm, S1, S2 normal, no murmur, click, rub or gallop Lymph nodes: Cervical, supraclavicular, and axillary nodes normal. Neurologic: Alert and oriented X 3, normal strength and  tone. Normal symmetric reflexes. Normal coordination and gait   Assessment/Plan: Hypertension BP stable today. Asymptomatic. Taking medications as directed. Will continue medication as directed.   Diet-controlled diabetes mellitus ([place]) Continue lifestyle recommendations. Recheck labs today to include CMP and A1C. Patient to schedule repeat eye examination as is overdue. Immunizations are up-to-date.   Hyperlipidemia Repeat fasting labs today. Continue medication and lifestyle changes.

## 2018-09-15 NOTE — Assessment & Plan Note (Signed)
BP stable today. Asymptomatic. Taking medications as directed. Will continue medication as directed.

## 2018-09-15 NOTE — Assessment & Plan Note (Signed)
Continue lifestyle recommendations. Recheck labs today to include CMP and A1C. Patient to schedule repeat eye examination as is overdue. Immunizations are up-to-date.

## 2018-09-16 LAB — HEPATITIS C ANTIBODY
Hepatitis C Ab: NONREACTIVE
SIGNAL TO CUT-OFF: 0.01 (ref <1.00)

## 2018-09-17 ENCOUNTER — Encounter: Payer: Self-pay | Admitting: Emergency Medicine

## 2018-09-18 ENCOUNTER — Telehealth: Payer: Self-pay | Admitting: Physician Assistant

## 2018-09-18 NOTE — Telephone Encounter (Signed)
Pt called and requested to get his labs again; he says that he was in a bad area when he got the initial call; pt given results per Raiford Noble, PA, " Labs stable overall. Hep C screen negative. Continue current regimen for now. Follow-up 6 months."; he verbalized understanding

## 2018-09-18 NOTE — Telephone Encounter (Signed)
Per previous telephone encounter dated 09/18/2018; pt to schedule 6 month follow up; left message on pt voicemail; when pt calls back please schedule appointment.

## 2018-10-24 DIAGNOSIS — Z8546 Personal history of malignant neoplasm of prostate: Secondary | ICD-10-CM | POA: Diagnosis not present

## 2018-10-29 ENCOUNTER — Other Ambulatory Visit: Payer: Self-pay | Admitting: Physician Assistant

## 2018-10-29 DIAGNOSIS — E785 Hyperlipidemia, unspecified: Secondary | ICD-10-CM

## 2018-10-31 DIAGNOSIS — Z8546 Personal history of malignant neoplasm of prostate: Secondary | ICD-10-CM | POA: Diagnosis not present

## 2018-10-31 DIAGNOSIS — N5201 Erectile dysfunction due to arterial insufficiency: Secondary | ICD-10-CM | POA: Diagnosis not present

## 2018-11-11 ENCOUNTER — Telehealth: Payer: Self-pay | Admitting: Physician Assistant

## 2018-11-11 NOTE — Telephone Encounter (Signed)
Spoke with patient. Has not had eye examination in the past 12 months. Is on the list but specialist has rescheduled him for summer for eye examination.   Will make note to check this in the next few months.

## 2018-11-11 NOTE — Telephone Encounter (Signed)
Reviewing HM. Based on records he is still overdue for diabetic eye examination. Please verify with him if he has had in the past 12 months. If so, where and with whom? We will need to request records. If not, encourage him to schedule. Ok to place referral to ophthalmology if needed.

## 2018-11-20 ENCOUNTER — Encounter: Payer: Self-pay | Admitting: Physician Assistant

## 2018-11-20 ENCOUNTER — Ambulatory Visit (INDEPENDENT_AMBULATORY_CARE_PROVIDER_SITE_OTHER): Payer: PPO | Admitting: Physician Assistant

## 2018-11-20 ENCOUNTER — Ambulatory Visit: Payer: PPO

## 2018-11-20 VITALS — Ht 71.0 in | Wt 220.0 lb

## 2018-11-20 DIAGNOSIS — Z0000 Encounter for general adult medical examination without abnormal findings: Secondary | ICD-10-CM

## 2018-11-20 DIAGNOSIS — Z7189 Other specified counseling: Secondary | ICD-10-CM

## 2018-11-20 NOTE — Progress Notes (Signed)
I have discussed the procedure for the virtual visit with the patient who has given consent to proceed with assessment and treatment.   Thomas Stuart S Gwen Sarvis, CMA     

## 2018-11-20 NOTE — Progress Notes (Addendum)
Virtual Visit via Video   I connected with Dannie Hattabaugh Muckleroy on 11/20/18 at 10:40 AM EDT by a video enabled telemedicine application and verified that I am speaking with the correct person using two identifiers.  Location patient: Home Location provider: [facility], Office Persons participating in the virtual visit: Patient, Provider, [place] (Patina Moore)  I discussed the limitations of evaluation and management by telemedicine and the availability of in person appointments. The patient expressed understanding and agreed to proceed.  Subjective:   HPI:    Patient presents via [employer] today for medicare wellness visit. Patient had his routine follow-up in February at which time labs were obtained. Notes continuing chronic medications as directed and doing well. Recently saw his Urologist and got a good report. Per patient they should be sending records to [place]. Patient denies any acute concerns today. Is due now for his 2nd Shringrix vaccine and will return to pharmacy to have done.  Is up-to-date on immunizations.   Screening questions performed by CMA on rooming (see appropriate section of EMR). No concerns with ADLs. He does not have advanced directives but is interested in more information about this. Is due for MMSE today.  ROS: See pertinent positives and negatives per HPI.  Patient Active Problem List   Diagnosis Date Noted  . Encounter for hepatitis C virus screening test for high risk patient 09/15/2018  . Visit for preventive health examination 01/21/2017  . Hyperlipidemia 01/21/2017  . Diet-controlled diabetes mellitus ([place]) 01/21/2017  . Lipoma of neck 02/01/2016  . History of radical prostatectomy 02/01/2016  . Osteoarthritis 01/31/2016  . 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 Hypertension 01/31/2016  . History of prostate cancer 06/14/2014  . Elevated PSA, between 10 and less than 20 ng/ml 04/15/2014  . Benign prostatic hyperplasia with urinary obstruction  04/15/2014  . Hx of adenomatous polyp of colon 03/24/2008    Social History   Tobacco Use  . Smoking status: Never Smoker  . Smokeless tobacco: Never Used  Substance Use Topics  . Alcohol use: No    Current Outpatient Medications:  .  atorvastatin (LIPITOR) 10 MG tablet, TAKE 1 TABLET BY MOUTH EVERY DAY, Disp: 90 tablet, Rfl: 1 .  benazepril-hydrochlorthiazide (LOTENSIN HCT) 10-12.5 MG tablet, TAKE 1 TABLET BY MOUTH EVERY DAY TO CONTROL BLOOD PRESSURE, Disp: 90 tablet, Rfl: 1 .  Coenzyme Q10 (COQ10) 400 MG CAPS, Take 1 capsule by mouth daily., Disp: , Rfl:  .  multivitamin-iron-minerals-folic acid (CENTRUM) chewable tablet, Chew 1 tablet by mouth daily., Disp: 30 tablet, Rfl: 0 .  vitamin E (VITAMIN E) 1000 UNIT capsule, Take 1,000 Units by mouth daily., Disp: , Rfl:   No Known Allergies  Family History  Problem Relation Age of Onset  . Breast cancer Mother 60  . Dementia Mother   . Transient ischemic attack Mother   . Prostate cancer Father 13       prostate  . Healthy Sister   . Healthy Brother   . Colon cancer Neg Hx    Health Maintenance Due  Topic Date Due  . OPHTHALMOLOGY EXAM  12/06/2017   Care Teams: Raynelle Bring (Urology) Blair Heys (Ophthalmology)  Objective:   Ht 5\' 11"  (1.803 m)   Wt 220 lb (99.8 kg)   BMI 30.68 kg/m   Patient is well-developed, well-nourished in no acute distress.  Resting comfortably on couch at home.  Head is normocephalic, atraumatic.  No labored breathing.  Speech is clear and coherent with logical contest.  Patient is alert and oriented at baseline.    Assessment and Plan:     1. Medicare annual wellness visit, subsequent During the course of the visit the patient was educated and counseled about appropriate screening and preventive services including: Fall prevention, Bone densitometry screening, Diabetes screening, Nutrition counseling.  Patient UTD on required immunizations. MMSE today with score of 29/30. Will monitor  yearly.   2. Advanced care planning/counseling discussion Discussed living will and healthcare POA. Will mail packet to patient to review. He is planning on completing with his lawyer. He is to bring by a completed copy for scanning into the EMR once able.     Leeanne Rio, PA-C 11/20/2018

## 2019-01-14 ENCOUNTER — Telehealth: Payer: Self-pay | Admitting: Emergency Medicine

## 2019-01-14 NOTE — Telephone Encounter (Signed)
Pt returned call to the office. Attempted to transfer pt but there was no answer. Pt  stated he contacted the eye doctor but his appt is not until July because it had not been a year since his last exam.

## 2019-01-14 NOTE — Telephone Encounter (Signed)
-----   Message from Brunetta Jeans, PA-C sent at 01/13/2019  7:41 AM EDT ----- Can we please call patient to see if he has scheduled his eye exam yet. If not, please remind him to do so. Thank you.  ----- Message ----- From: Delorse Limber Sent: 11/11/2018   3:03 PM EDT To: Brunetta Jeans, PA-C  Eye Exam -- Was supposed to schedule. Check on this.

## 2019-01-14 NOTE — Telephone Encounter (Signed)
LMOVM advising patient to schedule a eye exam if he hasn't already gotten done.

## 2019-01-14 NOTE — Telephone Encounter (Signed)
PCP aware

## 2019-02-25 DIAGNOSIS — H2513 Age-related nuclear cataract, bilateral: Secondary | ICD-10-CM | POA: Diagnosis not present

## 2019-02-25 LAB — HM DIABETES EYE EXAM

## 2019-02-26 ENCOUNTER — Encounter: Payer: Self-pay | Admitting: Physician Assistant

## 2019-04-03 ENCOUNTER — Other Ambulatory Visit: Payer: Self-pay | Admitting: Physician Assistant

## 2019-04-03 DIAGNOSIS — I10 Essential (primary) hypertension: Secondary | ICD-10-CM

## 2019-05-04 ENCOUNTER — Other Ambulatory Visit: Payer: Self-pay | Admitting: Physician Assistant

## 2019-05-04 DIAGNOSIS — E785 Hyperlipidemia, unspecified: Secondary | ICD-10-CM

## 2019-05-04 NOTE — Telephone Encounter (Signed)
Due for 6 month follow up appointment

## 2019-05-06 ENCOUNTER — Other Ambulatory Visit: Payer: Self-pay

## 2019-05-13 ENCOUNTER — Other Ambulatory Visit: Payer: Self-pay

## 2019-05-13 ENCOUNTER — Encounter: Payer: Self-pay | Admitting: Physician Assistant

## 2019-05-13 ENCOUNTER — Ambulatory Visit (INDEPENDENT_AMBULATORY_CARE_PROVIDER_SITE_OTHER): Payer: PPO | Admitting: Physician Assistant

## 2019-05-13 VITALS — BP 124/82 | HR 74 | Temp 98.7°F | Resp 16 | Ht 71.0 in | Wt 223.0 lb

## 2019-05-13 DIAGNOSIS — I10 Essential (primary) hypertension: Secondary | ICD-10-CM | POA: Diagnosis not present

## 2019-05-13 DIAGNOSIS — R7303 Prediabetes: Secondary | ICD-10-CM | POA: Diagnosis not present

## 2019-05-13 DIAGNOSIS — E785 Hyperlipidemia, unspecified: Secondary | ICD-10-CM

## 2019-05-13 DIAGNOSIS — Z23 Encounter for immunization: Secondary | ICD-10-CM | POA: Diagnosis not present

## 2019-05-13 LAB — COMPREHENSIVE METABOLIC PANEL
ALT: 25 U/L (ref 0–53)
AST: 20 U/L (ref 0–37)
Albumin: 4.4 g/dL (ref 3.5–5.2)
Alkaline Phosphatase: 63 U/L (ref 39–117)
BUN: 8 mg/dL (ref 6–23)
CO2: 26 mEq/L (ref 19–32)
Calcium: 9.5 mg/dL (ref 8.4–10.5)
Chloride: 104 mEq/L (ref 96–112)
Creatinine, Ser: 0.75 mg/dL (ref 0.40–1.50)
GFR: 125.63 mL/min (ref >60.00)
Glucose, Bld: 104 mg/dL — ABNORMAL HIGH (ref 70–99)
Potassium: 4 mEq/L (ref 3.5–5.1)
Sodium: 140 mEq/L (ref 135–145)
Total Bilirubin: 0.7 mg/dL (ref 0.2–1.2)
Total Protein: 6.6 g/dL (ref 6.0–8.3)

## 2019-05-13 LAB — HEMOGLOBIN A1C: Hgb A1c MFr Bld: 6.4 % (ref 4.6–6.5)

## 2019-05-13 LAB — LIPID PANEL
Cholesterol: 138 mg/dL (ref 0–200)
HDL: 39.3 mg/dL (ref >39.00)
LDL Cholesterol: 83 mg/dL (ref 0–99)
NonHDL: 98.27
Total CHOL/HDL Ratio: 4
Triglycerides: 76 mg/dL (ref 0.0–149.0)
VLDL: 15.2 mg/dL (ref 0.0–40.0)

## 2019-05-13 NOTE — Patient Instructions (Signed)
Please go to the lab today for blood work.  I will call you with your results. We will alter treatment regimen(s) if indicated by your results.    DASH Eating Plan DASH stands for "Dietary Approaches to Stop Hypertension." The DASH eating plan is a healthy eating plan that has been shown to reduce high blood pressure (hypertension). It may also reduce your risk for type 2 diabetes, heart disease, and stroke. The DASH eating plan may also help with weight loss. What are tips for following this plan?  General guidelines  Avoid eating more than 2,300 mg (milligrams) of salt (sodium) a day. If you have hypertension, you may need to reduce your sodium intake to 1,500 mg a day.  Limit alcohol intake to no more than 1 drink a day for nonpregnant women and 2 drinks a day for men. One drink equals 12 oz of beer, 5 oz of wine, or 1 oz of hard liquor.  Work with your health care provider to maintain a healthy body weight or to lose weight. Ask what an ideal weight is for you.  Get at least 30 minutes of exercise that causes your heart to beat faster (aerobic exercise) most days of the week. Activities may include walking, swimming, or biking.  Work with your health care provider or diet and nutrition specialist (dietitian) to adjust your eating plan to your individual calorie needs. Reading food labels   Check food labels for the amount of sodium per serving. Choose foods with less than 5 percent of the Daily Value of sodium. Generally, foods with less than 300 mg of sodium per serving fit into this eating plan.  To find whole grains, look for the word "whole" as the first word in the ingredient list. Shopping  Buy products labeled as "low-sodium" or "no salt added."  Buy fresh foods. Avoid canned foods and premade or frozen meals. Cooking  Avoid adding salt when cooking. Use salt-free seasonings or herbs instead of table salt or sea salt. Check with your health care provider or pharmacist  before using salt substitutes.  Do not fry foods. Cook foods using healthy methods such as baking, boiling, grilling, and broiling instead.  Cook with heart-healthy oils, such as olive, canola, soybean, or sunflower oil. Meal planning  Eat a balanced diet that includes: ? 5 or more servings of fruits and vegetables each day. At each meal, try to fill half of your plate with fruits and vegetables. ? Up to 6-8 servings of whole grains each day. ? Less than 6 oz of lean meat, poultry, or fish each day. A 3-oz serving of meat is about the same size as a deck of cards. One egg equals 1 oz. ? 2 servings of low-fat dairy each day. ? A serving of nuts, seeds, or beans 5 times each week. ? Heart-healthy fats. Healthy fats called Omega-3 fatty acids are found in foods such as flaxseeds and coldwater fish, like sardines, salmon, and mackerel.  Limit how much you eat of the following: ? Canned or prepackaged foods. ? Food that is high in trans fat, such as fried foods. ? Food that is high in saturated fat, such as fatty meat. ? Sweets, desserts, sugary drinks, and other foods with added sugar. ? Full-fat dairy products.  Do not salt foods before eating.  Try to eat at least 2 vegetarian meals each week.  Eat more home-cooked food and less restaurant, buffet, and fast food.  When eating at a restaurant, ask that  your food be prepared with less salt or no salt, if possible. What foods are recommended? The items listed may not be a complete list. Talk with your dietitian about what dietary choices are best for you. Grains Whole-grain or whole-wheat bread. Whole-grain or whole-wheat pasta. Brown rice. [place]. Bulgur. Whole-grain and low-sodium cereals. Pita bread. Low-fat, low-sodium crackers. Whole-wheat flour tortillas. Vegetables Fresh or frozen vegetables (raw, steamed, roasted, or grilled). Low-sodium or reduced-sodium tomato and vegetable juice. Low-sodium or reduced-sodium tomato  sauce and tomato paste. Low-sodium or reduced-sodium canned vegetables. Fruits All fresh, dried, or frozen fruit. Canned fruit in natural juice (without added sugar). Meat and other protein foods Skinless chicken or [place]. Ground chicken or [place]. Pork with fat trimmed off. Fish and seafood. Egg whites. Dried beans, peas, or lentils. Unsalted nuts, nut butters, and seeds. Unsalted canned beans. Lean cuts of beef with fat trimmed off. Low-sodium, lean deli meat. Dairy Low-fat (1%) or fat-free (skim) milk. Fat-free, low-fat, or reduced-fat cheeses. Nonfat, low-sodium ricotta or cottage cheese. Low-fat or nonfat yogurt. Low-fat, low-sodium cheese. Fats and oils Soft margarine without trans fats. Vegetable oil. Low-fat, reduced-fat, or light mayonnaise and salad dressings (reduced-sodium). Canola, safflower, olive, soybean, and sunflower oils. Avocado. Seasoning and other foods Herbs. Spices. Seasoning mixes without salt. Unsalted popcorn and pretzels. Fat-free sweets. What foods are not recommended? The items listed may not be a complete list. Talk with your dietitian about what dietary choices are best for you. Grains Baked goods made with fat, such as croissants, muffins, or some breads. Dry pasta or rice meal packs. Vegetables Creamed or fried vegetables. Vegetables in a cheese sauce. Regular canned vegetables (not low-sodium or reduced-sodium). Regular canned tomato sauce and paste (not low-sodium or reduced-sodium). Regular tomato and vegetable juice (not low-sodium or reduced-sodium). Angie Fava. Olives. Fruits Canned fruit in a light or heavy syrup. Fried fruit. Fruit in cream or butter sauce. Meat and other protein foods Fatty cuts of meat. Ribs. Fried meat. Berniece Salines. Sausage. Bologna and other processed lunch meats. Salami. Fatback. Hotdogs. Bratwurst. Salted nuts and seeds. Canned beans with added salt. Canned or smoked fish. Whole eggs or egg yolks. Chicken or [place] with skin. Dairy Whole  or 2% milk, cream, and half-and-half. Whole or full-fat cream cheese. Whole-fat or sweetened yogurt. Full-fat cheese. Nondairy creamers. Whipped toppings. Processed cheese and cheese spreads. Fats and oils Butter. Stick margarine. Lard. Shortening. Ghee. Bacon fat. Tropical oils, such as coconut, palm kernel, or palm oil. Seasoning and other foods Salted popcorn and pretzels. Onion salt, garlic salt, seasoned salt, table salt, and sea salt. Worcestershire sauce. Tartar sauce. Barbecue sauce. Teriyaki sauce. Soy sauce, including reduced-sodium. Steak sauce. Canned and packaged gravies. Fish sauce. Oyster sauce. Cocktail sauce. Horseradish that you find on the shelf. Ketchup. Mustard. Meat flavorings and tenderizers. Bouillon cubes. Hot sauce and Tabasco sauce. Premade or packaged marinades. Premade or packaged taco seasonings. Relishes. Regular salad dressings. Where to find more information:  National Heart, Lung, and [place]: https://wilson-eaton.com/  American Heart Association: www.heart.org Summary  The DASH eating plan is a healthy eating plan that has been shown to reduce high blood pressure (hypertension). It may also reduce your risk for type 2 diabetes, heart disease, and stroke.  With the DASH eating plan, you should limit salt (sodium) intake to 2,300 mg a day. If you have hypertension, you may need to reduce your sodium intake to 1,500 mg a day.  When on the DASH eating plan, aim to eat more fresh fruits and  vegetables, whole grains, lean proteins, low-fat dairy, and heart-healthy fats.  Work with your health care provider or diet and nutrition specialist (dietitian) to adjust your eating plan to your individual calorie needs. This information is not intended to replace advice given to you by your health care provider. Make sure you discuss any questions you have with your health care provider. Document Released: 07/12/2011 Document Revised: 07/05/2017 Document Reviewed: 07/16/2016  Elsevier Patient Education  2020 [employer].

## 2019-05-13 NOTE — Progress Notes (Signed)
Patient presents to clinic today for follow-up of hypertension, hyperlipidemia and pre-diabetes.   Patient is currently on a regimen of Benazepril-HCTZ 10-12.5 mg and Atorvastatin 10 mg QD. Endorses taking medications as directed. Tolerating well.. Had previously been well-controlled on this medication. Patient denies chest pain, palpitations, lightheadedness, dizziness, vision changes or frequent headaches. Is trying to cut back on sweet tea and has noted a 7 lb weight loss with this. Drinking mainly water and occasionally a Powerade. Is watching portion sizes. Is riding his bicycle a few times per week.   BP Readings from Last 3 Encounters:  05/13/19 124/82  09/15/18 122/86  01/22/18 124/82   Endorses having flu shot a few months ago at [facility].    Past Medical History:  Diagnosis Date  . Arthritis    lower back  . BPH (benign prostatic hyperplasia)   . Erectile dysfunction   . Hematuria   . Hypertension   . Lumbar disc disease   . Nephrolithiasis   . Personal history of colonic polyps 2004, 2009, 2017   adenomas  . 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 PSA elevation   . Urinary retention     Current Outpatient Medications on File Prior to Visit  Medication Sig Dispense Refill  . atorvastatin (LIPITOR) 10 MG tablet TAKE 1 TABLET BY MOUTH EVERY DAY 30 tablet 0  . benazepril-hydrochlorthiazide (LOTENSIN HCT) 10-12.5 MG tablet TAKE 1 TABLET BY MOUTH EVERY DAY TO CONTROL BLOOD PRESSURE 90 tablet 1  . Coenzyme Q10 (COQ10) 400 MG CAPS Take 1 capsule by mouth daily.    . multivitamin-iron-minerals-folic acid (CENTRUM) chewable tablet Chew 1 tablet by mouth daily. 30 tablet 0  . vitamin E (VITAMIN E) 1000 UNIT capsule Take 1,000 Units by mouth daily.     No current facility-administered medications on file prior to visit.     No Known Allergies  Family History  Problem Relation Age of Onset  . Breast cancer Mother 45  . Dementia Mother   . Transient  ischemic attack Mother   . Prostate cancer Father 49       prostate  . Healthy Sister   . Healthy Brother   . Colon cancer Neg Hx     Social History   Socioeconomic History  . Marital status: Single    Spouse name: Not on file  . Number of children: Not on file  . Years of education: Not on file  . Highest education level: Not on file  Occupational History  . Not on file  Social Needs  . Financial resource strain: Not on file  . Food insecurity    Worry: Not on file    Inability: Not on file  . Transportation needs    Medical: Not on file    Non-medical: Not on file  Tobacco Use  . Smoking status: Never Smoker  . Smokeless tobacco: Never Used  Substance and Sexual Activity  . Alcohol use: No  . Drug use: No  . Sexual activity: Yes  Lifestyle  . Physical activity    Days per week: Not on file    Minutes per session: Not on file  . Stress: Not on file  Relationships  . Social Herbalist on phone: Not on file    Gets together: Not on file    Attends religious service: Not on file    Active member of club or organization: Not on file    Attends  meetings of clubs or organizations: Not on file    Relationship status: Not on file  Other Topics Concern  . Not on file  Social History Narrative  . Not on file   Review of Systems - See HPI.  All other ROS are negative.  BP 124/82   Pulse 74   Temp 98.7 F (37.1 C) (Temporal)   Resp 16   Ht 5\' 11"  (1.803 m)   Wt 223 lb (101.2 kg)   SpO2 98%   BMI 31.10 kg/m   Physical Exam Vitals signs reviewed.  Constitutional:      Appearance: Normal appearance.  HENT:     Head: Normocephalic and atraumatic.  Neck:     Musculoskeletal: Neck supple.  Cardiovascular:     Rate and Rhythm: Normal rate and regular rhythm.     Pulses: Normal pulses.     Heart sounds: Normal heart sounds.  Pulmonary:     Effort: Pulmonary effort is normal.  Abdominal:     Palpations: Abdomen is soft.  Neurological:      General: No focal deficit present.     Mental Status: He is alert and oriented to person, place, and time.  Psychiatric:        Mood and Affect: Mood normal.    Recent Results (from the past 2160 hour(s))  [facility] DIABETES EYE EXAM     Status: None   Collection Time: 02/25/19 12:00 AM  Result Value Ref Range   [facility] Diabetic Eye Exam No Retinopathy No Retinopathy   Assessment/Plan: 1. Essential hypertension Bp normotensive. Asymptomatic. Continue current regimen. Repeat labs today.  2. Pre-diabetes 8 pound weight loss with diet and exercise. Recheck labs today to include A1C.  3. Hyperlipidemia, unspecified hyperlipidemia type Taking statin. Tolerating well. Dietary and exercise recommendations. Fasting labs today.   Leeanne Rio, PA-C

## 2019-05-15 ENCOUNTER — Telehealth: Payer: Self-pay | Admitting: Physician Assistant

## 2019-05-15 NOTE — Telephone Encounter (Signed)
Spoke with patient about his lab results. Encouraged to cut out sweets or do sugar free. Start Cinnamon 500 mg supplement daily to help with sugar levels.

## 2019-05-15 NOTE — Telephone Encounter (Signed)
Pt would like you to return his call back lab results.

## 2019-05-30 ENCOUNTER — Other Ambulatory Visit: Payer: Self-pay | Admitting: Physician Assistant

## 2019-05-30 DIAGNOSIS — E785 Hyperlipidemia, unspecified: Secondary | ICD-10-CM

## 2019-07-25 DIAGNOSIS — Z03818 Encounter for observation for suspected exposure to other biological agents ruled out: Secondary | ICD-10-CM | POA: Diagnosis not present

## 2019-09-26 ENCOUNTER — Other Ambulatory Visit: Payer: Self-pay | Admitting: Physician Assistant

## 2019-09-26 DIAGNOSIS — I10 Essential (primary) hypertension: Secondary | ICD-10-CM

## 2019-10-02 ENCOUNTER — Ambulatory Visit: Payer: PPO | Attending: Internal Medicine

## 2019-10-02 DIAGNOSIS — Z23 Encounter for immunization: Secondary | ICD-10-CM

## 2019-10-02 NOTE — Progress Notes (Signed)
   Covid-19 Vaccination Clinic  Name:  AUGUS FEINSTEIN    MRN: [id number] DOB: [birth year]  10/02/2019  Mr. Gathers was observed post Covid-19 immunization for 15 minutes without incidence. He was provided with Vaccine Information Sheet and instruction to access the V-Safe system.   Mr. Tousant was instructed to call 911 with any severe reactions post vaccine: Marland Kitchen Difficulty breathing  . Swelling of your face and throat  . A fast heartbeat  . A bad rash all over your body  . Dizziness and weakness    Immunizations Administered    Name Date Dose VIS Date Route   Pfizer COVID-19 Vaccine 10/02/2019  9:11 AM 0.3 mL 07/17/2019 Intramuscular   Manufacturer: [place]   Lot: [id number]   [place]: [id number]

## 2019-10-28 ENCOUNTER — Ambulatory Visit: Payer: PPO | Attending: Internal Medicine

## 2019-10-28 DIAGNOSIS — Z23 Encounter for immunization: Secondary | ICD-10-CM

## 2019-10-28 NOTE — Progress Notes (Signed)
   Covid-19 Vaccination Clinic  Name:  Thomas Stuart    MRN: [id number] DOB: 1951/04/06  10/28/2019  Mr. Mckinney was observed post Covid-19 immunization for 15 minutes without incident. He was provided with Vaccine Information Sheet and instruction to access the V-Safe system.   Mr. Hauger was instructed to call 911 with any severe reactions post vaccine: Marland Kitchen Difficulty breathing  . Swelling of face and throat  . A fast heartbeat  . A bad rash all over body  . Dizziness and weakness   Immunizations Administered    Name Date Dose VIS Date Route   Pfizer COVID-19 Vaccine 10/28/2019  9:13 AM 0.3 mL 07/17/2019 Intramuscular   Manufacturer: [place]   Lot: [id number]   [place]: [id number]

## 2019-11-09 DIAGNOSIS — Z8546 Personal history of malignant neoplasm of prostate: Secondary | ICD-10-CM | POA: Diagnosis not present

## 2019-11-13 DIAGNOSIS — Z8546 Personal history of malignant neoplasm of prostate: Secondary | ICD-10-CM | POA: Diagnosis not present

## 2019-11-13 DIAGNOSIS — N5201 Erectile dysfunction due to arterial insufficiency: Secondary | ICD-10-CM | POA: Diagnosis not present

## 2019-11-20 ENCOUNTER — Other Ambulatory Visit: Payer: Self-pay | Admitting: Physician Assistant

## 2019-11-20 DIAGNOSIS — E785 Hyperlipidemia, unspecified: Secondary | ICD-10-CM

## 2020-03-16 ENCOUNTER — Other Ambulatory Visit: Payer: Self-pay | Admitting: Physician Assistant

## 2020-03-16 DIAGNOSIS — I10 Essential (primary) hypertension: Secondary | ICD-10-CM

## 2020-03-21 ENCOUNTER — Ambulatory Visit: Payer: PPO

## 2020-03-28 NOTE — Progress Notes (Signed)
Subjective:   Thomas Stuart is a 69 y.o. male who presents for Medicare Annual/Subsequent preventive examination.  Review of Systems     Cardiac Risk Factors include: advanced age (>66men, >38 women);hypertension;dyslipidemia;male gender;obesity (BMI >30kg/m2)     Objective:    Today's Vitals   03/29/20 0930  BP: (!) 146/82  Pulse: 77  Resp: 16  Temp: 98.4 F (36.9 C)  TempSrc: Oral  SpO2: 96%  Weight: 228 lb (103.4 kg)  Height: 5\' 11"  (1.803 m)   Body mass index is 31.8 kg/m.  Advanced Directives 03/29/2020 11/20/2018 06/25/2016 06/16/2014 06/14/2014 06/14/2014 06/07/2014  Does Patient Have a Medical Advance Directive? No No No No No No No  Would patient like information on creating a medical advance directive? No - Patient declined Yes (MAU/Ambulatory/Procedural Areas - Information given) No - patient declined information No - patient declined information No - patient declined information No - patient declined information No - patient declined information    Current Medications (verified) Outpatient Encounter Medications as of 03/29/2020  Medication Sig  . atorvastatin (LIPITOR) 10 MG tablet TAKE 1 TABLET BY MOUTH EVERY DAY  . benazepril-hydrochlorthiazide (LOTENSIN HCT) 10-12.5 MG tablet TAKE 1 TABLET BY MOUTH EVERY DAY TO CONTROL BLOOD PRESSURE  . Coenzyme Q10 (COQ10) 400 MG CAPS Take 1 capsule by mouth daily.  . multivitamin-iron-minerals-folic acid (CENTRUM) chewable tablet Chew 1 tablet by mouth daily.  . tadalafil (CIALIS) 20 MG tablet Take 20 mg by mouth as directed.  . vitamin E (VITAMIN E) 1000 UNIT capsule Take 1,000 Units by mouth daily.   No facility-administered encounter medications on file as of 03/29/2020.    Allergies (verified) Patient has no known allergies.   History: Past Medical History:  Diagnosis Date  . Arthritis    lower back  . BPH (benign prostatic hyperplasia)   . Erectile dysfunction   . Hematuria   . Hypertension   . Lumbar disc  disease   . Nephrolithiasis   . Personal history of colonic polyps 2004, 2009, 2017   adenomas  . 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 PSA elevation   . Urinary retention    Past Surgical History:  Procedure Laterality Date  . APPENDECTOMY    . COLONOSCOPY  2004, 03/2008, 06/15/2011, 06/2016   2004 - 6 mm polyp, 2009 4 adenomas max 10 mm 2012 - no polyps 2017 diminutive adenoma  . CYSTOSCOPY  2010   with fulgeration  . CYSTOSCOPY  2006    w/ stone removal  . CYSTOSCOPY N/A 04/15/2014   Procedure: CYSTOSCOPY , WITH PROSTATE  BIOPSY;  Surgeon: Malka So, MD;  Location: [facility];  Service: Urology;  Laterality: N/A;  . INGUINAL HERNIA REPAIR  2011   left  . LITHOTRIPSY  2008   kidney stone  . LYMPHADENECTOMY Bilateral 06/14/2014   Procedure: LYMPHADENECTOMY;  Surgeon: Raynelle Bring, MD;  Location: [facility];  Service: Urology;  Laterality: Bilateral;  . PROSTATE BIOPSY  01/2007   benign, TURP  . PROSTATE BIOPSY N/A 04/15/2014   Procedure: PROSTATE ULTRASOUND/BIOPSY ;  Surgeon: Malka So, MD;  Location: [facility];  Service: Urology;  Laterality: N/A;  . PROSTATE SURGERY    . ROBOT ASSISTED LAPAROSCOPIC RADICAL PROSTATECTOMY N/A 06/14/2014   Procedure: ROBOTIC ASSISTED LAPAROSCOPIC RADICAL PROSTATECTOMY LEVEL 3;  Surgeon: Raynelle Bring, MD;  Location: [facility];  Service: Urology;  Laterality: N/A;  . TONSILLECTOMY    . TRANSURETHRAL  RESECTION OF PROSTATE  2007   Family History  Problem Relation Age of Onset  . Breast cancer Mother 8  . Dementia Mother   . Transient ischemic attack Mother   . Prostate cancer Father 61       prostate  . Healthy Sister   . Healthy Brother   . Colon cancer Neg Hx    Social History   Socioeconomic History  . Marital status: Single    Spouse name: Not on file  . Number of children: Not on file  . Years of education: Not on file  . Highest education level: Not on file  Occupational  History  . Occupation: reired  Tobacco Use  . Smoking status: Never Smoker  . Smokeless tobacco: Never Used  Substance and Sexual Activity  . Alcohol use: No  . Drug use: No  . Sexual activity: Yes  Other Topics Concern  . Not on file  Social History Narrative  . Not on file   Social Determinants of Health   Financial Resource Strain: Low Risk   . Difficulty of Paying Living Expenses: Not hard at all  Food Insecurity: No Food Insecurity  . Worried About Charity fundraiser in the Last Year: Never true  . Ran Out of Food in the Last Year: Never true  Transportation Needs: No Transportation Needs  . Lack of Transportation (Medical): No  . Lack of Transportation (Non-Medical): No  Physical Activity: Insufficiently Active  . Days of Exercise per Week: 3 days  . Minutes of Exercise per Session: 30 min  Stress: No Stress Concern Present  . Feeling of Stress : Not at all  Social Connections: Moderately Integrated  . Frequency of Communication with Friends and Family: More than three times a week  . Frequency of Social Gatherings with Friends and Family: More than three times a week  . Attends Religious Services: More than 4 times per year  . Active Member of Clubs or Organizations: Yes  . Attends Archivist Meetings: More than 4 times per year  . Marital Status: Never married    Tobacco Counseling Counseling given: Not Answered   Clinical Intake:  Pre-visit preparation completed: Yes  Pain : No/denies pain     Nutritional Status: BMI > 30  Obese Nutritional Risks: None Diabetes: No  How often do you need to have someone help you when you read instructions, pamphlets, or other written materials from your doctor or pharmacy?: 1 - Never What is the last grade level you completed in school?: 12th grade  Diabetic?No  Interpreter Needed?: No  Information entered by :: Lesly Dukes LPN   Activities of Daily Living In your present state of health, do you  have any difficulty performing the following activities: 03/29/2020  Hearing? N  Vision? N  Difficulty concentrating or making decisions? N  Walking or climbing stairs? N  Dressing or bathing? N  Doing errands, shopping? N  Preparing Food and eating ? N  Using the Toilet? N  In the past six months, have you accidently leaked urine? N  Do you have problems with loss of bowel control? N  Managing your Medications? N  Managing your Finances? N  Housekeeping or managing your Housekeeping? N  Some recent data might be hidden    Patient Care Team: Delorse Limber as PCP - General (Family Medicine) Raynelle Bring, MD as Consulting Physician (Urology) Otelia Sergeant, OD as Referring Physician (Ophthalmology)  Indicate any recent Medical Services  you may have received from other than [facility] providers in the past year (date may be approximate).     Assessment:   This is a routine wellness examination for Harrell.  Hearing/Vision screen  Hearing Screening   125Hz  250Hz  500Hz  1000Hz  2000Hz  3000Hz  4000Hz  6000Hz  8000Hz   Right ear:           Left ear:           Comments: No issues  Vision Screening Comments: Reading glasses Last eye exam-2020  Dietary issues and exercise activities discussed: Current Exercise Habits: Home exercise routine, Type of exercise: Other - see comments (riding a bike), Time (Minutes): 30, Frequency (Times/Week): 3, Weekly Exercise (Minutes/Week): 90, Intensity: Mild  Goals    . Patient Stated     Lose weight by eating healthier      Depression Screen PHQ 2/9 Scores 03/29/2020 11/20/2018 01/22/2018 01/21/2017 11/27/2016 11/27/2016 06/01/2014  PHQ - 2 Score 0 0 0 0 0 0 0  PHQ- 9 Score - - - - - 0 -    Fall Risk Fall Risk  03/29/2020 11/20/2018 09/15/2018 01/22/2018 01/21/2017  Falls in the past year? 0 0 0 No No  Number falls in past yr: 0 0 0 - -  Injury with Fall? 0 0 0 - -  Follow up Falls prevention discussed Falls evaluation completed Falls evaluation  completed - -    Any stairs in or around the home? No  Home free of loose throw rugs in walkways, pet beds, electrical cords, etc? Yes  Adequate lighting in your home to reduce risk of falls? Yes   ASSISTIVE DEVICES UTILIZED TO PREVENT FALLS:  Life alert? No  Use of a cane, walker or w/c? No  Grab bars in the bathroom? No  Shower chair or bench in shower? No  Elevated toilet seat or a handicapped toilet? No   TIMED UP AND GO:  Was the test performed? Yes .  Length of time to ambulate 10 feet: 10 sec.   Gait steady and fast without use of assistive device  Cognitive Function: No cognitive impairment noted.  MMSE - Mini Mental State Exam 11/20/2018  Orientation to time 5  Orientation to Place 5  Registration 3  Attention/ Calculation 5  Recall 2  Language- name 2 objects 2  Language- repeat 1  Language- follow 3 step command 3  Language- read & follow direction 1  Write a sentence 1  Copy design 1  Total score 29        Immunizations Immunization History  Administered Date(s) Administered  . Fluad Quad(high Dose 65+) 05/13/2019  . Influenza, High Dose Seasonal PF 04/29/2017, 05/27/2018  . Influenza,inj,Quad PF,6+ Mos 06/06/2016, 05/15/2017  . Influenza-Unspecified 06/06/2016  . PFIZER SARS-COV-2 Vaccination 10/02/2019, 10/28/2019  . Pneumococcal Conjugate-13 08/15/2017, 05/27/2018  . Tdap 10/26/2009  . Zoster Recombinat (Shingrix) 09/18/2018, 01/05/2019    TDAP status: Due, Education has been provided regarding the importance of this vaccine. Advised may receive this vaccine at local pharmacy or Health Dept. Aware to provide a copy of the vaccination record if obtained from local pharmacy or Health Dept. Verbalized acceptance and understanding.   Flu Vaccine status: Up to date   Pneumococcal vaccine status: Declined,  Education has been provided regarding the importance of this vaccine but patient still declined. Advised may receive this vaccine at local  pharmacy or Health Dept. Aware to provide a copy of the vaccination record if obtained from local pharmacy or Health Dept. Verbalized acceptance and  understanding.    Covid-19 vaccine status: Completed vaccines  Qualifies for Shingles Vaccine? No   Zostavax completed No   Shingrix Completed?: Yes  Screening Tests Health Maintenance  Topic Date Due  . DTAP VACCINES (1) 05/14/1951  . PNA vac Low Risk Adult (2 of 2 - PPSV23) 05/28/2019  . DTaP/Tdap/Td (2 - Td or Tdap) 10/27/2019  . TETANUS/TDAP  10/27/2019  . INFLUENZA VACCINE  03/06/2020  . COLONOSCOPY  06/25/2021  . COVID-19 Vaccine  Completed  . Hepatitis C Screening  Completed    Health Maintenance  Health Maintenance Due  Topic Date Due  . DTAP VACCINES (1) 05/14/1951  . PNA vac Low Risk Adult (2 of 2 - PPSV23) 05/28/2019  . DTaP/Tdap/Td (2 - Td or Tdap) 10/27/2019  . TETANUS/TDAP  10/27/2019  . INFLUENZA VACCINE  03/06/2020    Colorectal cancer screening: Completed 06/25/2016. Repeat every 5 years  Lung Cancer Screening: (Low Dose CT Chest recommended if Age 19-80 years, 30 pack-year currently smoking OR have quit w/in 15years.) does not qualify.     Additional Screening:  Hepatitis C Screening: Completed 09/15/2018  Vision Screening: Recommended annual ophthalmology exams for early detection of glaucoma and other disorders of the eye. Is the patient up to date with their annual eye exam?  Yes  Who is the provider or what is the name of the office in which the patient attends annual eye exams? Unsure of name   Dental Screening: Recommended annual dental exams for proper oral hygiene  Community Resource Referral / Chronic Care Management: CRR required this visit?  No   CCM required this visit?  No      Plan:     I have personally reviewed and noted the following in the patient's chart:   . Medical and social history . Use of alcohol, tobacco or illicit drugs  . Current medications and  supplements . Functional ability and status . Nutritional status . Physical activity . Advanced directives . List of other physicians . Hospitalizations, surgeries, and ER visits in previous 12 months . Vitals . Screenings to include cognitive, depression, and falls . Referrals and appointments  In addition, I have reviewed and discussed with patient certain preventive protocols, quality metrics, and best practice recommendations. A written personalized care plan for preventive services as well as general preventive health recommendations were provided to patient.     Marta Antu, LPN   [id number]  [facility]  Nurse Notes: None

## 2020-03-29 ENCOUNTER — Ambulatory Visit (INDEPENDENT_AMBULATORY_CARE_PROVIDER_SITE_OTHER): Payer: PPO | Admitting: Physician Assistant

## 2020-03-29 ENCOUNTER — Ambulatory Visit (INDEPENDENT_AMBULATORY_CARE_PROVIDER_SITE_OTHER): Payer: PPO

## 2020-03-29 ENCOUNTER — Other Ambulatory Visit: Payer: Self-pay

## 2020-03-29 ENCOUNTER — Encounter: Payer: Self-pay | Admitting: Physician Assistant

## 2020-03-29 VITALS — BP 146/82 | HR 77 | Temp 98.4°F | Resp 16 | Ht 71.0 in | Wt 228.0 lb

## 2020-03-29 VITALS — BP 132/84 | HR 77 | Temp 98.4°F | Resp 16 | Ht 71.0 in | Wt 228.0 lb

## 2020-03-29 DIAGNOSIS — E1165 Type 2 diabetes mellitus with hyperglycemia: Secondary | ICD-10-CM | POA: Insufficient documentation

## 2020-03-29 DIAGNOSIS — Z0000 Encounter for general adult medical examination without abnormal findings: Secondary | ICD-10-CM | POA: Diagnosis not present

## 2020-03-29 DIAGNOSIS — E785 Hyperlipidemia, unspecified: Secondary | ICD-10-CM | POA: Diagnosis not present

## 2020-03-29 DIAGNOSIS — Z125 Encounter for screening for malignant neoplasm of prostate: Secondary | ICD-10-CM

## 2020-03-29 DIAGNOSIS — I10 Essential (primary) hypertension: Secondary | ICD-10-CM

## 2020-03-29 DIAGNOSIS — R7303 Prediabetes: Secondary | ICD-10-CM

## 2020-03-29 LAB — LIPID PANEL
Cholesterol: 139 mg/dL (ref 0–200)
HDL: 41.7 mg/dL (ref >39.00)
LDL Cholesterol: 85 mg/dL (ref 0–99)
NonHDL: 97.21
Total CHOL/HDL Ratio: 3
Triglycerides: 60 mg/dL (ref 0.0–149.0)
VLDL: 12 mg/dL (ref 0.0–40.0)

## 2020-03-29 LAB — COMPREHENSIVE METABOLIC PANEL
ALT: 25 U/L (ref 0–53)
AST: 20 U/L (ref 0–37)
Albumin: 4.6 g/dL (ref 3.5–5.2)
Alkaline Phosphatase: 58 U/L (ref 39–117)
BUN: 16 mg/dL (ref 6–23)
CO2: 27 mEq/L (ref 19–32)
Calcium: 9.6 mg/dL (ref 8.4–10.5)
Chloride: 104 mEq/L (ref 96–112)
Creatinine, Ser: 0.82 mg/dL (ref 0.40–1.50)
GFR: 113.04 mL/min (ref >60.00)
Glucose, Bld: 107 mg/dL — ABNORMAL HIGH (ref 70–99)
Potassium: 4.1 mEq/L (ref 3.5–5.1)
Sodium: 140 mEq/L (ref 135–145)
Total Bilirubin: 0.6 mg/dL (ref 0.2–1.2)
Total Protein: 6.8 g/dL (ref 6.0–8.3)

## 2020-03-29 LAB — HEMOGLOBIN A1C: Hgb A1c MFr Bld: 6.2 % (ref 4.6–6.5)

## 2020-03-29 LAB — PSA, MEDICARE: PSA: 0 ng/ml — ABNORMAL LOW (ref 0.10–4.00)

## 2020-03-29 NOTE — Patient Instructions (Signed)
Please go to the lab today for blood work.  I will call you with your results. We will alter treatment regimen(s) if indicated by your results.    Preventive Care 68 Years and Older, Male Preventive care refers to lifestyle choices and visits with your health care provider that can promote health and wellness. This includes:  A yearly physical exam. This is also called an annual well check.  Regular dental and eye exams.  Immunizations.  Screening for certain conditions.  Healthy lifestyle choices, such as diet and exercise. What can I expect for my preventive care visit? Physical exam Your health care provider will check:  Height and weight. These may be used to calculate body mass index (BMI), which is a measurement that tells if you are at a healthy weight.  Heart rate and blood pressure.  Your skin for abnormal spots. Counseling Your health care provider may ask you questions about:  Alcohol, tobacco, and drug use.  Emotional well-being.  Home and relationship well-being.  Sexual activity.  Eating habits.  History of falls.  Memory and ability to understand (cognition).  Work and work environment. What immunizations do I need?  Influenza (flu) vaccine  This is recommended every year. Tetanus, diphtheria, and pertussis (Tdap) vaccine  You may need a Td booster every 10 years. Varicella (chickenpox) vaccine  You may need this vaccine if you have not already been vaccinated. Zoster (shingles) vaccine  You may need this after age 60. Pneumococcal conjugate (PCV13) vaccine  One dose is recommended after age 68. Pneumococcal polysaccharide (PPSV23) vaccine  One dose is recommended after age 68. Measles, mumps, and rubella (MMR) vaccine  You may need at least one dose of MMR if you were born in 1957 or later. You may also need a second dose. Meningococcal conjugate (MenACWY) vaccine  You may need this if you have certain conditions. Hepatitis A  vaccine  You may need this if you have certain conditions or if you travel or work in places where you may be exposed to hepatitis A. Hepatitis B vaccine  You may need this if you have certain conditions or if you travel or work in places where you may be exposed to hepatitis B. Haemophilus influenzae type b (Hib) vaccine  You may need this if you have certain conditions. You may receive vaccines as individual doses or as more than one vaccine together in one shot (combination vaccines). Talk with your health care provider about the risks and benefits of combination vaccines. What tests do I need? Blood tests  Lipid and cholesterol levels. These may be checked every 5 years, or more frequently depending on your overall health.  Hepatitis C test.  Hepatitis B test. Screening  Lung cancer screening. You may have this screening every year starting at age 55 if you have a 30-pack-year history of smoking and currently smoke or have quit within the past 15 years.  Colorectal cancer screening. All adults should have this screening starting at age 50 and continuing until age 75. Your health care provider may recommend screening at age 45 if you are at increased risk. You will have tests every 1-10 years, depending on your results and the type of screening test.  Prostate cancer screening. Recommendations will vary depending on your family history and other risks.  Diabetes screening. This is done by checking your blood sugar (glucose) after you have not eaten for a while (fasting). You may have this done every 1-3 years.  Abdominal aortic aneurysm (  AAA) screening. You may need this if you are a current or former smoker.  Sexually transmitted disease (STD) testing. Follow these instructions at home: Eating and drinking  Eat a diet that includes fresh fruits and vegetables, whole grains, lean protein, and low-fat dairy products. Limit your intake of foods with high amounts of sugar, saturated  fats, and salt.  Take vitamin and mineral supplements as recommended by your health care provider.  Do not drink alcohol if your health care provider tells you not to drink.  If you drink alcohol: ? Limit how much you have to 0-2 drinks a day. ? Be aware of how much alcohol is in your drink. In the [place] one drink equals one 12 oz bottle of beer (355 mL), one 5 oz glass of wine (148 mL), or one 1 oz glass of hard liquor (44 mL). Lifestyle  Take daily care of your teeth and gums.  Stay active. Exercise for at least 30 minutes on 5 or more days each week.  Do not use any products that contain nicotine or tobacco, such as cigarettes, e-cigarettes, and chewing tobacco. If you need help quitting, ask your health care provider.  If you are sexually active, practice safe sex. Use a condom or other form of protection to prevent STIs (sexually transmitted infections).  Talk with your health care provider about taking a low-dose aspirin or statin. What's next?  Visit your health care provider once a year for a well check visit.  Ask your health care provider how often you should have your eyes and teeth checked.  Stay up to date on all vaccines. This information is not intended to replace advice given to you by your health care provider. Make sure you discuss any questions you have with your health care provider. Document Revised: 07/17/2018 Document Reviewed: 07/17/2018 Elsevier Patient Education  2020 [employer]   

## 2020-03-29 NOTE — Patient Instructions (Signed)
Thomas Stuart , Thank you for taking time to come for your Medicare Wellness Visit. I appreciate your ongoing commitment to your health goals. Please review the following plan we discussed and let me know if I can assist you in the future.   Screening recommendations/referrals: Colonoscopy: Completed 06/25/2016-Due 06/25/2022 Recommended yearly ophthalmology/optometry visit for glaucoma screening and checkup Recommended yearly dental visit for hygiene and checkup  Vaccinations: Influenza vaccine: Due 04/2020 Pneumococcal vaccine: Pneumovax 23 due-Discuss with pharmacy or PCP. Tdap vaccine: Discuss with pharmacy Shingles vaccine: Completed vaccines   Covid-19: Completed vaccines  Advanced directives: Discussed. Declined information at this time  Conditions/risks identified: See problem list  Next appointment: Follow up in one year for your annual wellness visit.   Preventive Care 67 Years and Older, Male Preventive care refers to lifestyle choices and visits with your health care provider that can promote health and wellness. What does preventive care include?  A yearly physical exam. This is also called an annual well check.  Dental exams once or twice a year.  Routine eye exams. Ask your health care provider how often you should have your eyes checked.  Personal lifestyle choices, including:  Daily care of your teeth and gums.  Regular physical activity.  Eating a healthy diet.  Avoiding tobacco and drug use.  Limiting alcohol use.  Practicing safe sex.  Taking low doses of aspirin every day.  Taking vitamin and mineral supplements as recommended by your health care provider. What happens during an annual well check? The services and screenings done by your health care provider during your annual well check will depend on your age, overall health, lifestyle risk factors, and family history of disease. Counseling  Your health care provider may ask you questions about  your:  Alcohol use.  Tobacco use.  Drug use.  Emotional well-being.  Home and relationship well-being.  Sexual activity.  Eating habits.  History of falls.  Memory and ability to understand (cognition).  Work and work Statistician. Screening  You may have the following tests or measurements:  Height, weight, and BMI.  Blood pressure.  Lipid and cholesterol levels. These may be checked every 5 years, or more frequently if you are over 28 years old.  Skin check.  Lung cancer screening. You may have this screening every year starting at age 2 if you have a 30-pack-year history of smoking and currently smoke or have quit within the past 15 years.  Fecal occult blood test (FOBT) of the stool. You may have this test every year starting at age 6.  Flexible sigmoidoscopy or colonoscopy. You may have a sigmoidoscopy every 5 years or a colonoscopy every 10 years starting at age 84.  Prostate cancer screening. Recommendations will vary depending on your family history and other risks.  Hepatitis C blood test.  Hepatitis B blood test.  Sexually transmitted disease (STD) testing.  Diabetes screening. This is done by checking your blood sugar (glucose) after you have not eaten for a while (fasting). You may have this done every 1-3 years.  Abdominal aortic aneurysm (AAA) screening. You may need this if you are a current or former smoker.  Osteoporosis. You may be screened starting at age 80 if you are at high risk. Talk with your health care provider about your test results, treatment options, and if necessary, the need for more tests. Vaccines  Your health care provider may recommend certain vaccines, such as:  Influenza vaccine. This is recommended every year.  Tetanus, diphtheria, and  acellular pertussis (Tdap, Td) vaccine. You may need a Td booster every 10 years.  Zoster vaccine. You may need this after age 25.  Pneumococcal 13-valent conjugate (PCV13) vaccine.  One dose is recommended after age 28.  Pneumococcal polysaccharide (PPSV23) vaccine. One dose is recommended after age 54. Talk to your health care provider about which screenings and vaccines you need and how often you need them. This information is not intended to replace advice given to you by your health care provider. Make sure you discuss any questions you have with your health care provider. Document Released: 08/19/2015 Document Revised: 04/11/2016 Document Reviewed: 05/24/2015 Elsevier Interactive Patient Education  2017 [place] Prevention in the Home Falls can cause injuries. They can happen to people of all ages. There are many things you can do to make your home safe and to help prevent falls. What can I do on the outside of my home?  Regularly fix the edges of walkways and driveways and fix any cracks.  Remove anything that might make you trip as you walk through a door, such as a raised step or threshold.  Trim any bushes or trees on the path to your home.  Use bright outdoor lighting.  Clear any walking paths of anything that might make someone trip, such as rocks or tools.  Regularly check to see if handrails are loose or broken. Make sure that both sides of any steps have handrails.  Any raised decks and porches should have guardrails on the edges.  Have any leaves, snow, or ice cleared regularly.  Use sand or salt on walking paths during winter.  Clean up any spills in your garage right away. This includes oil or grease spills. What can I do in the bathroom?  Use night lights.  Install grab bars by the toilet and in the tub and shower. Do not use towel bars as grab bars.  Use non-skid mats or decals in the tub or shower.  If you need to sit down in the shower, use a plastic, non-slip stool.  Keep the floor dry. Clean up any water that spills on the floor as soon as it happens.  Remove soap buildup in the tub or shower regularly.  Attach bath  mats securely with double-sided non-slip rug tape.  Do not have throw rugs and other things on the floor that can make you trip. What can I do in the bedroom?  Use night lights.  Make sure that you have a light by your bed that is easy to reach.  Do not use any sheets or blankets that are too big for your bed. They should not hang down onto the floor.  Have a firm chair that has side arms. You can use this for support while you get dressed.  Do not have throw rugs and other things on the floor that can make you trip. What can I do in the kitchen?  Clean up any spills right away.  Avoid walking on wet floors.  Keep items that you use a lot in easy-to-reach places.  If you need to reach something above you, use a strong step stool that has a grab bar.  Keep electrical cords out of the way.  Do not use floor polish or wax that makes floors slippery. If you must use wax, use non-skid floor wax.  Do not have throw rugs and other things on the floor that can make you trip. What can I do with my stairs?  Do not leave any items on the stairs.  Make sure that there are handrails on both sides of the stairs and use them. Fix handrails that are broken or loose. Make sure that handrails are as long as the stairways.  Check any carpeting to make sure that it is firmly attached to the stairs. Fix any carpet that is loose or worn.  Avoid having throw rugs at the top or bottom of the stairs. If you do have throw rugs, attach them to the floor with carpet tape.  Make sure that you have a light switch at the top of the stairs and the bottom of the stairs. If you do not have them, ask someone to add them for you. What else can I do to help prevent falls?  Wear shoes that:  Do not have high heels.  Have rubber bottoms.  Are comfortable and fit you well.  Are closed at the toe. Do not wear sandals.  If you use a stepladder:  Make sure that it is fully opened. Do not climb a closed  stepladder.  Make sure that both sides of the stepladder are locked into place.  Ask someone to hold it for you, if possible.  Clearly mark and make sure that you can see:  Any grab bars or handrails.  First and last steps.  Where the edge of each step is.  Use tools that help you move around (mobility aids) if they are needed. These include:  Canes.  Walkers.  Scooters.  Crutches.  Turn on the lights when you go into a dark area. Replace any light bulbs as soon as they burn out.  Set up your furniture so you have a clear path. Avoid moving your furniture around.  If any of your floors are uneven, fix them.  If there are any pets around you, be aware of where they are.  Review your medicines with your doctor. Some medicines can make you feel dizzy. This can increase your chance of falling. Ask your doctor what other things that you can do to help prevent falls. This information is not intended to replace advice given to you by your health care provider. Make sure you discuss any questions you have with your health care provider. Document Released: 05/19/2009 Document Revised: 12/29/2015 Document Reviewed: 08/27/2014 Elsevier Interactive Patient Education  2017 [employer].

## 2020-03-29 NOTE — Progress Notes (Signed)
Patient presents to clinic today for follow-up of chronic medical conditions after seeing our Health Coach for Medicare Wellness Visit this morning.   Endorses he has been doing well overall. Keeping good appetite, mood and sleep.   Hypertension -- Is currently on a regimen of Lotensin-HCT 10-12.5. Notes he takes his medication as directed but ran out of the house this morning without taking his medication yet. Patient denies chest pain, palpitations, lightheadedness, dizziness, vision changes or frequent headaches. Has previously been well-controlled on this regimen.   BP Readings from Last 3 Encounters:  03/29/20 132/84  03/29/20 (!) 146/82  05/13/19 124/82   Hyperlipidemia -- Is currently on a regimen of Atorvastatin 10 mg daily. Is taking as directed. Endorses keeping a well-balanced diet.   Prediabetes-- Without complication. Has been diet controlled. Last A1C at 6.4 without medication. Has cut out carbohydrates and been staying more active in the past couple of weeks. Has lost 3-4 pounds so far.   Lab Results  Component Value Date   HGBA1C 6.4 05/13/2019   Past Medical History:  Diagnosis Date  . Arthritis    lower back  . BPH (benign prostatic hyperplasia)   . Erectile dysfunction   . Hematuria   . Hypertension   . Lumbar disc disease   . Nephrolithiasis   . Personal history of colonic polyps 2004, 2009, 2017   adenomas  . 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 PSA elevation   . Urinary retention     Current Outpatient Medications on File Prior to Visit  Medication Sig Dispense Refill  . atorvastatin (LIPITOR) 10 MG tablet TAKE 1 TABLET BY MOUTH EVERY DAY 90 tablet 1  . benazepril-hydrochlorthiazide (LOTENSIN HCT) 10-12.5 MG tablet TAKE 1 TABLET BY MOUTH EVERY DAY TO CONTROL BLOOD PRESSURE 90 tablet 1  . Coenzyme Q10 (COQ10) 400 MG CAPS Take 1 capsule by mouth daily.    . multivitamin-iron-minerals-folic acid (CENTRUM) chewable tablet Chew  1 tablet by mouth daily. 30 tablet 0  . tadalafil (CIALIS) 20 MG tablet Take 20 mg by mouth as directed.    . vitamin E (VITAMIN E) 1000 UNIT capsule Take 1,000 Units by mouth daily.     No current facility-administered medications on file prior to visit.    No Known Allergies  Family History  Problem Relation Age of Onset  . Breast cancer Mother 60  . Dementia Mother   . Transient ischemic attack Mother   . Prostate cancer Father 81       prostate  . Healthy Sister   . Healthy Brother   . Colon cancer Neg Hx     Social History   Socioeconomic History  . Marital status: Single    Spouse name: Not on file  . Number of children: Not on file  . Years of education: Not on file  . Highest education level: Not on file  Occupational History  . Occupation: reired  Tobacco Use  . Smoking status: Never Smoker  . Smokeless tobacco: Never Used  Vaping Use  . Vaping Use: Never used  Substance and Sexual Activity  . Alcohol use: No  . Drug use: No  . Sexual activity: Yes  Other Topics Concern  . Not on file  Social History Narrative  . Not on file   Social Determinants of Health   Financial Resource Strain: Low Risk   . Difficulty of Paying Living Expenses: Not hard at all  Food Insecurity: No  Food Insecurity  . Worried About Charity fundraiser in the Last Year: Never true  . Ran Out of Food in the Last Year: Never true  Transportation Needs: No Transportation Needs  . Lack of Transportation (Medical): No  . Lack of Transportation (Non-Medical): No  Physical Activity: Insufficiently Active  . Days of Exercise per Week: 3 days  . Minutes of Exercise per Session: 30 min  Stress: No Stress Concern Present  . Feeling of Stress : Not at all  Social Connections: Moderately Integrated  . Frequency of Communication with Friends and Family: More than three times a week  . Frequency of Social Gatherings with Friends and Family: More than three times a week  . Attends Religious  Services: More than 4 times per year  . Active Member of Clubs or Organizations: Yes  . Attends Archivist Meetings: More than 4 times per year  . Marital Status: Never married   Review of Systems - See HPI.  All other ROS are negative.  BP 132/84   Pulse 77   Temp 98.4 F (36.9 C) (Temporal)   Resp 16   Ht 5\' 11"  (1.803 m)   Wt 228 lb (103.4 kg)   SpO2 98%   BMI 31.80 kg/m   Physical Exam Vitals reviewed.  Constitutional:      Appearance: Normal appearance.  HENT:     Head: Normocephalic and atraumatic.     Right Ear: Tympanic membrane normal.     Left Ear: Tympanic membrane normal.  Cardiovascular:     Rate and Rhythm: Normal rate and regular rhythm.     Pulses: Normal pulses.     Heart sounds: Normal heart sounds.  Pulmonary:     Effort: Pulmonary effort is normal.     Breath sounds: Normal breath sounds.  Musculoskeletal:     Cervical back: Neck supple.  Neurological:     General: No focal deficit present.     Mental Status: He is alert and oriented to person, place, and time.  Psychiatric:        Mood and Affect: Mood normal.     Assessment/Plan: 1. Essential hypertension BP improved on recheck. Previously well-controlled. Has not taken medication this AM. Asymptomatic. No changes needed. Repeat fasting labs today. - Comprehensive metabolic panel - Lipid panel  2. Hyperlipidemia, unspecified hyperlipidemia type Taking medication as directed. Encouraged him to keep working on diet and exercise for continued weight loss. Repeat fasting lipids today. - Comprehensive metabolic panel - Lipid panel  3. Prediabetes Eye exam up-to-date. Repeat labs today. Is getting Pneumovax at the pharmacy. Also plans to get his yearly high-dose influenza vaccine once available.  - Comprehensive metabolic panel - Hemoglobin A1c  4. Prostate cancer screening Due.  He indicates understanding of the limitations of this screening test and wishes to proceed with  screening PSA testing.  - PSA, Medicare  This visit occurred during the SARS-CoV-2 public health emergency.  Safety protocols were in place, including screening questions prior to the visit, additional usage of staff PPE, and extensive cleaning of exam room while observing appropriate contact time as indicated for disinfecting solutions.     Leeanne Rio, PA-C

## 2020-05-13 ENCOUNTER — Other Ambulatory Visit: Payer: Self-pay | Admitting: Physician Assistant

## 2020-05-13 DIAGNOSIS — E785 Hyperlipidemia, unspecified: Secondary | ICD-10-CM

## 2020-09-05 ENCOUNTER — Other Ambulatory Visit: Payer: Self-pay | Admitting: Physician Assistant

## 2020-09-05 DIAGNOSIS — I10 Essential (primary) hypertension: Secondary | ICD-10-CM

## 2020-09-27 ENCOUNTER — Ambulatory Visit: Payer: PPO | Admitting: Physician Assistant

## 2020-09-29 DIAGNOSIS — H2513 Age-related nuclear cataract, bilateral: Secondary | ICD-10-CM | POA: Diagnosis not present

## 2020-11-02 ENCOUNTER — Other Ambulatory Visit: Payer: Self-pay | Admitting: Physician Assistant

## 2020-11-02 DIAGNOSIS — E785 Hyperlipidemia, unspecified: Secondary | ICD-10-CM

## 2020-11-02 DIAGNOSIS — Z8546 Personal history of malignant neoplasm of prostate: Secondary | ICD-10-CM | POA: Diagnosis not present

## 2020-11-09 DIAGNOSIS — Z8546 Personal history of malignant neoplasm of prostate: Secondary | ICD-10-CM | POA: Diagnosis not present

## 2020-11-09 DIAGNOSIS — N5201 Erectile dysfunction due to arterial insufficiency: Secondary | ICD-10-CM | POA: Diagnosis not present

## 2020-11-25 ENCOUNTER — Encounter: Payer: Self-pay | Admitting: Family Medicine

## 2020-11-25 ENCOUNTER — Ambulatory Visit (INDEPENDENT_AMBULATORY_CARE_PROVIDER_SITE_OTHER): Payer: PPO | Admitting: Family Medicine

## 2020-11-25 ENCOUNTER — Other Ambulatory Visit: Payer: Self-pay

## 2020-11-25 VITALS — BP 157/93 | HR 86 | Temp 97.6°F | Wt 225.0 lb

## 2020-11-25 DIAGNOSIS — I10 Essential (primary) hypertension: Secondary | ICD-10-CM

## 2020-11-25 DIAGNOSIS — E785 Hyperlipidemia, unspecified: Secondary | ICD-10-CM | POA: Diagnosis not present

## 2020-11-25 DIAGNOSIS — Z8546 Personal history of malignant neoplasm of prostate: Secondary | ICD-10-CM

## 2020-11-25 MED ORDER — BENAZEPRIL-HYDROCHLOROTHIAZIDE 10-12.5 MG PO TABS: 1.0000 | ORAL_TABLET | Freq: Every day | ORAL | 3 refills | Status: DC

## 2020-11-25 NOTE — Assessment & Plan Note (Addendum)
Follows with urology.  His last several PSAs have been 0.

## 2020-11-25 NOTE — Progress Notes (Signed)
   Thomas Stuart is a 70 y.o. male who presents today for an office visit.  He is transitioning care to this office.   Assessment/Plan:  Chronic Problems Addressed Today: History of prostate cancer s/p prostatectomy Follows with urology.  His last several PSAs have been 0.  Hyperlipidemia Check labs next office visit.  Continue Lipitor 10 mg daily.  Hypertension Elevated today.  Usually well controlled at home.  Has whitecoat hypertension.  We will continue current regimen with benazepril-HCTZ 10- 12.5 once daily.  Follow-up in 6 months.     Subjective:  HPI:  See A/P       Objective:  Physical Exam: BP (!) 157/93   Pulse 86   Temp 97.6 F (36.4 C) (Temporal)   Wt 225 lb (102.1 kg)   SpO2 99%   BMI 31.38 kg/m   Gen: No acute distress, resting comfortably CV: Regular rate and rhythm with no murmurs appreciated Pulm: Normal work of breathing, clear to auscultation bilaterally with no crackles, wheezes, or rhonchi Neuro: Grossly normal, moves all extremities Psych: Normal affect and thought content      Thomas Stuart M. Jerline Pain, MD 11/25/2020 3:23 PM

## 2020-11-25 NOTE — Assessment & Plan Note (Signed)
Check labs next office visit.  Continue Lipitor 10 mg daily.

## 2020-11-25 NOTE — Patient Instructions (Signed)
It was very nice to see you today!  We will refill your meds today.  I will see back in 6 months for your annual checkup with labs.  Please come back to see me sooner if needed.  Take care, Dr Jerline Pain  PLEASE NOTE:  If you had any lab tests please let us know if you have not heard back within a few days. You may see your results on mychart before we have a chance to review them but we will give you a call once they are reviewed by [place]. If we ordered any referrals today, please let us know if you have not heard from their office within the next week.   Please try these tips to maintain a healthy lifestyle:   Eat at least 3 REAL meals and 1-2 snacks per day.  Aim for no more than 5 hours between eating.  If you eat breakfast, please do so within one hour of getting up.    Each meal should contain half fruits/vegetables, one quarter protein, and one quarter carbs (no bigger than a computer mouse)   Cut down on sweet beverages. This includes juice, soda, and sweet tea.     Drink at least 1 glass of water with each meal and aim for at least 8 glasses per day   Exercise at least 150 minutes every week.

## 2020-11-25 NOTE — Assessment & Plan Note (Signed)
Elevated today.  Usually well controlled at home.  Has whitecoat hypertension.  We will continue current regimen with benazepril-HCTZ 10- 12.5 once daily.  Follow-up in 6 months.

## 2020-12-16 ENCOUNTER — Other Ambulatory Visit: Payer: Self-pay | Admitting: *Deleted

## 2020-12-16 ENCOUNTER — Telehealth: Payer: Self-pay

## 2020-12-16 DIAGNOSIS — E785 Hyperlipidemia, unspecified: Secondary | ICD-10-CM

## 2020-12-16 MED ORDER — ATORVASTATIN CALCIUM 10 MG PO TABS: 1.0000 | ORAL_TABLET | Freq: Every day | ORAL | 1 refills | Status: DC

## 2020-12-16 NOTE — Telephone Encounter (Signed)
Rx send to pharmacy  

## 2020-12-16 NOTE — Telephone Encounter (Signed)
.   LAST APPOINTMENT DATE: 11/25/2020   NEXT APPOINTMENT DATE:@Visit  date not found  MEDICATION:atorvastatin (LIPITOR) 10 MG tablet   PHARMACY:[id number] - [place], [place] - [address] [place] [address] [place] [address]

## 2021-03-15 ENCOUNTER — Telehealth: Payer: Self-pay | Admitting: Family Medicine

## 2021-03-15 NOTE — Telephone Encounter (Signed)
Copied from [place] [phone]. Topic: Medicare AWV >> Mar 15, 2021 11:16 AM Harris-Coley, Hannah Beat wrote: Reason for CRM: Left message for patient to schedule Annual Wellness Visit.  Please schedule with Nurse Health Advisor Charlott Rakes, RN at [facility].  Please call [phone] ask for [facility]

## 2021-04-06 ENCOUNTER — Ambulatory Visit (INDEPENDENT_AMBULATORY_CARE_PROVIDER_SITE_OTHER): Payer: PPO

## 2021-04-06 ENCOUNTER — Other Ambulatory Visit: Payer: Self-pay

## 2021-04-06 VITALS — BP 124/82 | HR 80 | Temp 98.1°F | Wt 223.0 lb

## 2021-04-06 DIAGNOSIS — Z0000 Encounter for general adult medical examination without abnormal findings: Secondary | ICD-10-CM

## 2021-04-06 NOTE — Patient Instructions (Signed)
Thomas Stuart , Thank you for taking time to come for your Medicare Wellness Visit. I appreciate your ongoing commitment to your health goals. Please review the following plan we discussed and let me know if I can assist you in the future.   Screening recommendations/referrals: Colonoscopy: Done 06/25/16 repeat in 5 years due 06/25/21 Recommended yearly ophthalmology/optometry visit for glaucoma screening and checkup Recommended yearly dental visit for hygiene and checkup  Vaccinations: Influenza vaccine: Due Pneumococcal vaccine: Due Tdap vaccine: Due Shingles vaccine: Completed 09/18/18, 01/05/19   Covid-19: Completed 2/26, 3/24, 05/10/20 & 12/04/20  Advanced directives: Advance directive discussed with you today. I have provided a copy for you to complete at home and have notarized. Once this is complete please bring a copy in to our office so we can scan it into your chart.  Conditions/risks identified: Lose 25 lbs   Next appointment: Follow up in one year for your annual wellness visit.   Preventive Care 5 Years and Older, Male Preventive care refers to lifestyle choices and visits with your health care provider that can promote health and wellness. What does preventive care include? A yearly physical exam. This is also called an annual well check. Dental exams once or twice a year. Routine eye exams. Ask your health care provider how often you should have your eyes checked. Personal lifestyle choices, including: Daily care of your teeth and gums. Regular physical activity. Eating a healthy diet. Avoiding tobacco and drug use. Limiting alcohol use. Practicing safe sex. Taking low doses of aspirin every day. Taking vitamin and mineral supplements as recommended by your health care provider. What happens during an annual well check? The services and screenings done by your health care provider during your annual well check will depend on your age, overall health, lifestyle risk  factors, and family history of disease. Counseling  Your health care provider may ask you questions about your: Alcohol use. Tobacco use. Drug use. Emotional well-being. Home and relationship well-being. Sexual activity. Eating habits. History of falls. Memory and ability to understand (cognition). Work and work Statistician. Screening  You may have the following tests or measurements: Height, weight, and BMI. Blood pressure. Lipid and cholesterol levels. These may be checked every 5 years, or more frequently if you are over 34 years old. Skin check. Lung cancer screening. You may have this screening every year starting at age 63 if you have a 30-pack-year history of smoking and currently smoke or have quit within the past 15 years. Fecal occult blood test (FOBT) of the stool. You may have this test every year starting at age 71. Flexible sigmoidoscopy or colonoscopy. You may have a sigmoidoscopy every 5 years or a colonoscopy every 10 years starting at age 28. Prostate cancer screening. Recommendations will vary depending on your family history and other risks. Hepatitis C blood test. Hepatitis B blood test. Sexually transmitted disease (STD) testing. Diabetes screening. This is done by checking your blood sugar (glucose) after you have not eaten for a while (fasting). You may have this done every 1-3 years. Abdominal aortic aneurysm (AAA) screening. You may need this if you are a current or former smoker. Osteoporosis. You may be screened starting at age 46 if you are at high risk. Talk with your health care provider about your test results, treatment options, and if necessary, the need for more tests. Vaccines  Your health care provider may recommend certain vaccines, such as: Influenza vaccine. This is recommended every year. Tetanus, diphtheria, and acellular pertussis (  Tdap, Td) vaccine. You may need a Td booster every 10 years. Zoster vaccine. You may need this after age  16. Pneumococcal 13-valent conjugate (PCV13) vaccine. One dose is recommended after age 65. Pneumococcal polysaccharide (PPSV23) vaccine. One dose is recommended after age 66. Talk to your health care provider about which screenings and vaccines you need and how often you need them. This information is not intended to replace advice given to you by your health care provider. Make sure you discuss any questions you have with your health care provider. Document Released: 08/19/2015 Document Revised: 04/11/2016 Document Reviewed: 05/24/2015 Elsevier Interactive Patient Education  2017 [employer] Prevention in the Home Falls can cause injuries. They can happen to people of all ages. There are many things you can do to make your home safe and to help prevent falls. What can I do on the outside of my home? Regularly fix the edges of walkways and driveways and fix any cracks. Remove anything that might make you trip as you walk through a door, such as a raised step or threshold. Trim any bushes or trees on the path to your home. Use bright outdoor lighting. Clear any walking paths of anything that might make someone trip, such as rocks or tools. Regularly check to see if handrails are loose or broken. Make sure that both sides of any steps have handrails. Any raised decks and porches should have guardrails on the edges. Have any leaves, snow, or ice cleared regularly. Use sand or salt on walking paths during winter. Clean up any spills in your garage right away. This includes oil or grease spills. What can I do in the bathroom? Use night lights. Install grab bars by the toilet and in the tub and shower. Do not use towel bars as grab bars. Use non-skid mats or decals in the tub or shower. If you need to sit down in the shower, use a plastic, non-slip stool. Keep the floor dry. Clean up any water that spills on the floor as soon as it happens. Remove soap buildup in the tub or shower  regularly. Attach bath mats securely with double-sided non-slip rug tape. Do not have throw rugs and other things on the floor that can make you trip. What can I do in the bedroom? Use night lights. Make sure that you have a light by your bed that is easy to reach. Do not use any sheets or blankets that are too big for your bed. They should not hang down onto the floor. Have a firm chair that has side arms. You can use this for support while you get dressed. Do not have throw rugs and other things on the floor that can make you trip. What can I do in the kitchen? Clean up any spills right away. Avoid walking on wet floors. Keep items that you use a lot in easy-to-reach places. If you need to reach something above you, use a strong step stool that has a grab bar. Keep electrical cords out of the way. Do not use floor polish or wax that makes floors slippery. If you must use wax, use non-skid floor wax. Do not have throw rugs and other things on the floor that can make you trip. What can I do with my stairs? Do not leave any items on the stairs. Make sure that there are handrails on both sides of the stairs and use them. Fix handrails that are broken or loose. Make sure that handrails are  as long as the stairways. Check any carpeting to make sure that it is firmly attached to the stairs. Fix any carpet that is loose or worn. Avoid having throw rugs at the top or bottom of the stairs. If you do have throw rugs, attach them to the floor with carpet tape. Make sure that you have a light switch at the top of the stairs and the bottom of the stairs. If you do not have them, ask someone to add them for you. What else can I do to help prevent falls? Wear shoes that: Do not have high heels. Have rubber bottoms. Are comfortable and fit you well. Are closed at the toe. Do not wear sandals. If you use a stepladder: Make sure that it is fully opened. Do not climb a closed stepladder. Make sure that  both sides of the stepladder are locked into place. Ask someone to hold it for you, if possible. Clearly mark and make sure that you can see: Any grab bars or handrails. First and last steps. Where the edge of each step is. Use tools that help you move around (mobility aids) if they are needed. These include: Canes. Walkers. Scooters. Crutches. Turn on the lights when you go into a dark area. Replace any light bulbs as soon as they burn out. Set up your furniture so you have a clear path. Avoid moving your furniture around. If any of your floors are uneven, fix them. If there are any pets around you, be aware of where they are. Review your medicines with your doctor. Some medicines can make you feel dizzy. This can increase your chance of falling. Ask your doctor what other things that you can do to help prevent falls. This information is not intended to replace advice given to you by your health care provider. Make sure you discuss any questions you have with your health care provider. Document Released: 05/19/2009 Document Revised: 12/29/2015 Document Reviewed: 08/27/2014 Elsevier Interactive Patient Education  2017 [employer].

## 2021-04-06 NOTE — Progress Notes (Signed)
Subjective:   SUHAIB COBBLE is a 70 y.o. male who presents for Medicare Annual/Subsequent preventive examination.  Review of Systems     Cardiac Risk Factors include: advanced age (>22mn, >>82women);hypertension;dyslipidemia;male gender;obesity (BMI >30kg/m2)     Objective:    Today's Vitals   04/06/21 1516  BP: 124/82  Pulse: 80  Temp: 98.1 F (36.7 C)  SpO2: 96%  Weight: 223 lb (101.2 kg)   Body mass index is 31.1 kg/m.  Advanced Directives 04/06/2021 03/29/2020 11/20/2018 06/25/2016 06/16/2014 06/14/2014 06/14/2014  Does Patient Have a Medical Advance Directive? No No No No No No No  Does patient want to make changes to medical advance directive? Yes (MAU/Ambulatory/Procedural Areas - Information given) - - - - - -  Would patient like information on creating a medical advance directive? - No - Patient declined Yes (MAU/Ambulatory/Procedural Areas - Information given) No - patient declined information No - patient declined information No - patient declined information No - patient declined information    Current Medications (verified) Outpatient Encounter Medications as of 04/06/2021  Medication Sig   atorvastatin (LIPITOR) 10 MG tablet Take 1 tablet (10 mg total) by mouth daily.   benazepril-hydrochlorthiazide (LOTENSIN HCT) 10-12.5 MG tablet Take 1 tablet by mouth daily.   Coenzyme Q10 (COQ10) 400 MG CAPS Take 1 capsule by mouth daily.   Multiple Vitamin (ONE-A-DAY MENS PO) Take by mouth.   tadalafil (CIALIS) 20 MG tablet Take 20 mg by mouth as directed.   vitamin E 1000 UNIT capsule Take 1,000 Units by mouth daily.   PFIZER-BIONT COVID-19 VAC-TRIS SUSP injection    [DISCONTINUED] multivitamin-iron-minerals-folic acid (CENTRUM) chewable tablet Chew 1 tablet by mouth daily. (Patient not taking: No sig reported)   No facility-administered encounter medications on file as of 04/06/2021.    Allergies (verified) Patient has no known allergies.   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   PSA elevation    Urinary retention    Past Surgical History:  Procedure Laterality Date   APPENDECTOMY     COLONOSCOPY  2004, 03/2008, 06/15/2011, 06/2016   2004 - 6 mm polyp, 2009 4 adenomas max 10 mm 2012 - no polyps 2017 diminutive adenoma   CYSTOSCOPY  2010   with fulgeration   CYSTOSCOPY  2006    w/ stone removal   CYSTOSCOPY N/A 04/15/2014   Procedure: CYSTOSCOPY , WITH PROSTATE  BIOPSY;  Surgeon: JMalka So MD;  [facility]  Service: Urology;  Laterality: N/A;   INGUINAL HERNIA REPAIR  2011   left   LITHOTRIPSY  2008   kidney stone   LYMPHADENECTOMY Bilateral 06/14/2014   Procedure: LYMPHADENECTOMY;  Surgeon: LRaynelle Bring MD;  Location: [facility];  Service: Urology;  Laterality: Bilateral;   PROSTATE BIOPSY  01/2007   benign, TURP   PROSTATE BIOPSY N/A 04/15/2014   Procedure: PROSTATE ULTRASOUND/BIOPSY ;  Surgeon: JMalka So MD;  [facility]  Service: Urology;  Laterality: N/A;   PROSTATE SURGERY     ROBOT ASSISTED LAPAROSCOPIC RADICAL PROSTATECTOMY N/A 06/14/2014   Procedure: ROBOTIC ASSISTED LAPAROSCOPIC RADICAL  PROSTATECTOMY LEVEL 3;  Surgeon: Raynelle Bring, MD;  Location: [facility];  Service: Urology;  Laterality: N/A;   TONSILLECTOMY     TRANSURETHRAL RESECTION OF PROSTATE  2007   Family History  Problem Relation Age of Onset   Breast cancer Mother 95   Dementia Mother    Transient ischemic attack Mother    Prostate cancer Father 10       prostate   Healthy Sister    Healthy Brother    Colon cancer Neg Hx    Social History   Socioeconomic History   Marital status: Single    Spouse name: Not on  file   Number of children: Not on file   Years of education: Not on file   Highest education level: Not on file  Occupational History   Occupation: reired  Tobacco Use   Smoking status: Never   Smokeless tobacco: Never  Vaping Use   Vaping Use: Never used  Substance and Sexual Activity   Alcohol use: No   Drug use: No   Sexual activity: Yes  Other Topics Concern   Not on file  Social History Narrative   Not on file   Social Determinants of Health   Financial Resource Strain: Low Risk    Difficulty of Paying Living Expenses: Not hard at all  Food Insecurity: No Food Insecurity   Worried About Charity fundraiser in the Last Year: Never true   [place] in the Last Year: Never true  Transportation Needs: No Transportation Needs   Lack of Transportation (Medical): No   Lack of Transportation (Non-Medical): No  Physical Activity: Insufficiently Active   Days of Exercise per Week: 3 days   Minutes of Exercise per Session: 30 min  Stress: No Stress Concern Present   Feeling of Stress : Not at all  Social Connections: Moderately Isolated   Frequency of Communication with Friends and Family: Twice a week   Frequency of Social Gatherings with Friends and Family: More than three times a week   Attends Religious Services: More than 4 times per year   Active Member of [employer] or Organizations: No   Attends Music therapist: Never   Marital Status: Never married    Tobacco Counseling Counseling given: Not Answered   Clinical Intake:  Pre-visit preparation completed: Yes  Pain : No/denies pain     BMI - recorded: 31.1 Nutritional Status: BMI > 30  Obese Nutritional Risks: None Diabetes: No  How often do you need to have someone help you when you read instructions, pamphlets, or other written materials from your doctor or pharmacy?: 1 - Never  Diabetic?No  Interpreter Needed?: No  Information entered by :: Charlott Rakes, LPN   Activities of  Daily Living In your present state of health, do you have any difficulty performing the following activities: 04/06/2021  Hearing? N  Vision? N  Difficulty concentrating or making decisions? N  Walking or climbing stairs? N  Dressing or bathing? N  Doing errands, shopping? N  Preparing Food and eating ? N  Using the Toilet? N  In the past six months, have you accidently leaked urine? N  Do you have problems with loss of bowel control? N  Managing your Medications? N  Managing your Finances? N  Housekeeping or managing your Housekeeping? N  Some recent data might be hidden    Patient Care Team: Vivi Barrack, MD as PCP - General (Family Medicine) Raynelle Bring, MD as  Consulting Physician (Urology) Otelia Sergeant, OD as Referring Physician (Ophthalmology)  Indicate any recent Medical Services you may have received from other than [facility] providers in the past year (date may be approximate).     Assessment:   This is a routine wellness examination for X[place]ian.  Hearing/Vision screen Hearing Screening - Comments:: Pt denies any hearing issues Vision Screening - Comments:: Pt follows up with Dr Laurance Flatten for annual eye exams   Dietary issues and exercise activities discussed: Current Exercise Habits: Home exercise routine, Type of exercise: walking;Other - see comments (riding  bike), Time (Minutes): 30, Frequency (Times/Week): 3, Weekly Exercise (Minutes/Week): 90   Goals Addressed             This Visit's Progress    Patient Stated       Lose 25 lbs        Depression Screen PHQ 2/9 Scores 04/06/2021 03/29/2020 11/20/2018 01/22/2018 01/21/2017 11/27/2016 11/27/2016  PHQ - 2 Score 0 0 0 0 0 0 0  PHQ- 9 Score - - - - - - 0    Fall Risk Fall Risk  04/06/2021 03/29/2020 11/20/2018 09/15/2018 01/22/2018  Falls in the past year? 0 0 0 0 No  Number falls in past yr: 0 0 0 0 -  Injury with Fall? 0 0 0 0 -  Risk for fall due to : Impaired vision - - - -  Follow up Falls prevention discussed  Falls prevention discussed Falls evaluation completed Falls evaluation completed -    FALL RISK PREVENTION PERTAINING TO THE HOME:  Any stairs in or around the home? Yes  If so, are there any without handrails? No  Home free of loose throw rugs in walkways, pet beds, electrical cords, etc? Yes  Adequate lighting in your home to reduce risk of falls? Yes   ASSISTIVE DEVICES UTILIZED TO PREVENT FALLS:  Life alert? No  Use of a cane, walker or w/c? No  Grab bars in the bathroom? No  Shower chair or bench in shower? No  Elevated toilet seat or a handicapped toilet? No   TIMED UP AND GO:  Was the test performed? Yes .  Length of time to ambulate 10 feet: 10 sec.   Gait steady and fast without use of assistive device  Cognitive Function: MMSE - Mini Mental State Exam 11/20/2018  Orientation to time 5  Orientation to Place 5  Registration 3  Attention/ Calculation 5  Recall 2  Language- name 2 objects 2  Language- repeat 1  Language- follow 3 step command 3  Language- read & follow direction 1  Write a sentence 1  Copy design 1  Total score 29     6CIT Screen 04/06/2021  What Year? 0 points  What month? 0 points  What time? 0 points  Count back from 20 0 points  Months in reverse 0 points  Repeat phrase 0 points  Total Score 0    Immunizations Immunization History  Administered Date(s) Administered   Fluad Quad(high Dose 65+) 05/13/2019   Influenza, High Dose Seasonal PF 04/29/2017, 05/27/2018   Influenza,inj,Quad PF,6+ Mos 06/06/2016, 05/15/2017   Influenza-Unspecified 06/06/2016   PFIZER(Purple Top)SARS-COV-2 Vaccination 10/02/2019, 10/28/2019, 05/10/2020, 12/04/2020   Pneumococcal Conjugate-13 08/15/2017, 05/27/2018   Tdap 10/26/2009   Zoster Recombinat (Shingrix) 09/18/2018, 01/05/2019    TDAP status: Due, Education has been provided regarding the importance of this vaccine. Advised may receive this vaccine at local pharmacy or Health Dept. Aware to provide a  copy of  the vaccination record if obtained from local pharmacy or Health Dept. Verbalized acceptance and understanding.  Flu Vaccine status: Due, Education has been provided regarding the importance of this vaccine. Advised may receive this vaccine at local pharmacy or Health Dept. Aware to provide a copy of the vaccination record if obtained from local pharmacy or Health Dept. Verbalized acceptance and understanding.  Pneumococcal vaccine status: Due, Education has been provided regarding the importance of this vaccine. Advised may receive this vaccine at local pharmacy or Health Dept. Aware to provide a copy of the vaccination record if obtained from local pharmacy or Health Dept. Verbalized acceptance and understanding.  Covid-19 vaccine status: Completed vaccines  Qualifies for Shingles Vaccine? Yes   Zostavax completed Yes   Shingrix Completed?: Yes  Screening Tests Health Maintenance  Topic Date Due   PNA vac Low Risk Adult (2 of 2 - PPSV23) 05/28/2019   TETANUS/TDAP  10/27/2019   INFLUENZA VACCINE  03/06/2021   COLONOSCOPY (Pts 45-48yr Insurance coverage will need to be confirmed)  06/25/2021   COVID-19 Vaccine  Completed   Hepatitis C Screening  Completed   Zoster Vaccines- Shingrix  Completed   HPV VACCINES  Aged Out    Health Maintenance  Health Maintenance Due  Topic Date Due   PNA vac Low Risk Adult (2 of 2 - PPSV23) 05/28/2019   TETANUS/TDAP  10/27/2019   INFLUENZA VACCINE  03/06/2021    Colorectal cancer screening: Type of screening: Colonoscopy. Completed 06/25/16. Repeat every 5 years   Additional Screening:  Hepatitis C Screening:  Completed 09/15/18  Vision Screening: Recommended annual ophthalmology exams for early detection of glaucoma and other disorders of the eye. Is the patient up to date with their annual eye exam?  Yes  Who is the provider or what is the name of the office in which the patient attends annual eye exams? Dr BLaurance Flatten If pt is not  established with a provider, would they like to be referred to a provider to establish care? No .   Dental Screening: Recommended annual dental exams for proper oral hygiene  Community Resource Referral / Chronic Care Management: CRR required this visit?  No   CCM required this visit?  No      Plan:     I have personally reviewed and noted the following in the patient's chart:   Medical and social history Use of alcohol, tobacco or illicit drugs  Current medications and supplements including opioid prescriptions. Patient is not currently taking opioid prescriptions. Functional ability and status Nutritional status Physical activity Advanced directives List of other physicians Hospitalizations, surgeries, and ER visits in previous 12 months Vitals Screenings to include cognitive, depression, and falls Referrals and appointments  In addition, I have reviewed and discussed with patient certain preventive protocols, quality metrics, and best practice recommendations. A written personalized care plan for preventive services as well as general preventive health recommendations were provided to patient.     TWillette Brace LPN   [id number]  Nurse Notes: none

## 2021-05-22 ENCOUNTER — Encounter: Payer: Self-pay | Admitting: Internal Medicine

## 2021-05-23 ENCOUNTER — Encounter: Payer: Self-pay | Admitting: Internal Medicine

## 2021-06-09 ENCOUNTER — Other Ambulatory Visit: Payer: Self-pay | Admitting: Family Medicine

## 2021-06-09 DIAGNOSIS — E785 Hyperlipidemia, unspecified: Secondary | ICD-10-CM

## 2021-06-28 ENCOUNTER — Ambulatory Visit (AMBULATORY_SURGERY_CENTER): Payer: PPO

## 2021-06-28 ENCOUNTER — Encounter: Payer: Self-pay | Admitting: Internal Medicine

## 2021-06-28 ENCOUNTER — Other Ambulatory Visit: Payer: Self-pay

## 2021-06-28 VITALS — Ht 71.0 in | Wt 220.0 lb

## 2021-06-28 DIAGNOSIS — Z86010 Personal history of colonic polyps: Secondary | ICD-10-CM

## 2021-06-28 NOTE — Progress Notes (Signed)
Patient's pre-visit was done today over the phone with the patient. Name,DOB and address verified. Patient denies any allergies to Eggs and Soy. Patient denies any problems with anesthesia/sedation. Patient is not taking any diet pills or blood thinners. No home Oxygen. Packet of Prep instructions mailed to patient including a copy of a consent form-pt is aware. Patient understands to call us back with any questions or concerns. Patient is aware of our care-partner policy and [id number] safety protocol.    The patient is COVID-19 vaccinated.

## 2021-07-19 ENCOUNTER — Encounter: Payer: Self-pay | Admitting: Internal Medicine

## 2021-07-19 ENCOUNTER — Ambulatory Visit (AMBULATORY_SURGERY_CENTER): Payer: PPO | Admitting: Internal Medicine

## 2021-07-19 VITALS — BP 145/89 | HR 77 | Temp 98.6°F | Resp 12 | Ht 71.0 in | Wt 220.0 lb

## 2021-07-19 DIAGNOSIS — D125 Benign neoplasm of sigmoid colon: Secondary | ICD-10-CM | POA: Diagnosis not present

## 2021-07-19 DIAGNOSIS — D122 Benign neoplasm of ascending colon: Secondary | ICD-10-CM | POA: Diagnosis not present

## 2021-07-19 DIAGNOSIS — D123 Benign neoplasm of transverse colon: Secondary | ICD-10-CM | POA: Diagnosis not present

## 2021-07-19 DIAGNOSIS — Z86010 Personal history of colonic polyps: Secondary | ICD-10-CM

## 2021-07-19 DIAGNOSIS — I10 Essential (primary) hypertension: Secondary | ICD-10-CM | POA: Diagnosis not present

## 2021-07-19 DIAGNOSIS — D124 Benign neoplasm of descending colon: Secondary | ICD-10-CM | POA: Diagnosis not present

## 2021-07-19 MED ORDER — SODIUM CHLORIDE 0.9 % IV SOLN: 500.0000 mL | Freq: Once | INTRAVENOUS | Status: DC

## 2021-07-19 NOTE — Progress Notes (Signed)
[facility] History and Physical   Primary Care Physician:  Vivi Barrack, MD   Reason for Procedure:   Hx colon polyps  Plan:    colonoscopy     HPI: Thomas Stuart is a 70 y.o. male here for a surveillance colonoscopy.  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   PSA elevation    Urinary retention     Past Surgical History:  Procedure Laterality Date   APPENDECTOMY     COLONOSCOPY  2004, 03/2008, 06/15/2011, 06/2016   2004 - 6 mm polyp, 2009 4 adenomas max 10 mm 2012 - no polyps 2017 diminutive adenoma   CYSTOSCOPY  2010   with fulgeration   CYSTOSCOPY  2006   w/ stone removal   CYSTOSCOPY N/A 04/15/2014   Procedure: CYSTOSCOPY , WITH PROSTATE  BIOPSY;  Surgeon: Malka So, MD;  Location: [facility];  Service: Urology;  Laterality: N/A;   INGUINAL HERNIA REPAIR  2011   left   LITHOTRIPSY  2008   kidney stone   LYMPHADENECTOMY Bilateral 06/14/2014   Procedure: LYMPHADENECTOMY;  Surgeon: Raynelle Bring, MD;  Location: [facility];  Service: Urology;  Laterality: Bilateral;   POLYPECTOMY     PROSTATE BIOPSY  01/2007   benign, TURP   PROSTATE BIOPSY N/A 04/15/2014   Procedure: PROSTATE ULTRASOUND/BIOPSY ;  Surgeon: Malka So, MD;  Location: [facility];  Service: Urology;  Laterality: N/A;   PROSTATE SURGERY     ROBOT ASSISTED LAPAROSCOPIC RADICAL PROSTATECTOMY N/A 06/14/2014   Procedure: ROBOTIC ASSISTED LAPAROSCOPIC RADICAL PROSTATECTOMY LEVEL 3;  Surgeon: Raynelle Bring, MD;  Location: [facility];  Service: Urology;  Laterality: N/A;    TONSILLECTOMY     TRANSURETHRAL RESECTION OF PROSTATE  2007    Prior to Admission medications   Medication Sig Start Date End Date Taking? Authorizing Provider  atorvastatin (LIPITOR) 10 MG tablet TAKE 1 TABLET BY MOUTH EVERY DAY 06/09/21  Yes Vivi Barrack, MD  benazepril-hydrochlorthiazide (LOTENSIN HCT) 10-12.5 MG tablet Take 1 tablet by mouth daily. 11/25/20  Yes Vivi Barrack, MD  Coenzyme Q10 (COQ10) 400 MG CAPS Take 1 capsule by mouth daily.   Yes [provider]  Multiple Vitamin (ONE-A-DAY MENS PO) Take by mouth.   Yes [provider]  vitamin E 1000 UNIT capsule Take 1,000 Units by mouth daily.   Yes [provider]  PFIZER-BIONT COVID-19 VAC-TRIS SUSP injection  12/04/20   [provider]  tadalafil (CIALIS) 20 MG tablet Take 20 mg by mouth as directed. 10/05/19   [provider]    Current Outpatient Medications  Medication Sig Dispense Refill   atorvastatin (LIPITOR) 10 MG tablet TAKE 1 TABLET BY MOUTH EVERY DAY 90 tablet 1   benazepril-hydrochlorthiazide (LOTENSIN HCT) 10-12.5 MG tablet Take 1 tablet by mouth daily. 90 tablet 3   Coenzyme Q10 (COQ10) 400 MG CAPS Take 1 capsule by mouth daily.     Multiple Vitamin (ONE-A-DAY MENS PO) Take by mouth.  vitamin E 1000 UNIT capsule Take 1,000 Units by mouth daily.     PFIZER-BIONT COVID-19 VAC-TRIS SUSP injection      tadalafil (CIALIS) 20 MG tablet Take 20 mg by mouth as directed.     Current Facility-Administered Medications  Medication Dose Route Frequency Provider Last Rate Last Admin   0.9 %  sodium chloride infusion  500 mL Intravenous Once Gatha Mayer, MD        Allergies as of 07/19/2021   (No Known Allergies)    Family History  Problem Relation Age of Onset   Breast cancer Mother 25   Dementia Mother    Transient ischemic attack Mother    Healthy Sister    Healthy Brother    Colon cancer Neg Hx    Esophageal cancer Neg Hx    Rectal cancer Neg Hx     Stomach cancer Neg Hx     Social History   Socioeconomic History   Marital status: Single    Spouse name: Not on file   Number of children: Not on file   Years of education: Not on file   Highest education level: Not on file  Occupational History   Occupation: reired  Tobacco Use   Smoking status: Never   Smokeless tobacco: Never  Vaping Use   Vaping Use: Never used  Substance and Sexual Activity   Alcohol use: No   Drug use: No   Sexual activity: Yes  Other Topics Concern   Not on file  Social History Narrative   Not on file   Social Determinants of Health   Financial Resource Strain: Low Risk    Difficulty of Paying Living Expenses: Not hard at all  Food Insecurity: No Food Insecurity   Worried About Charity fundraiser in the Last Year: Never true   Ran Out of Food in the Last Year: Never true  Transportation Needs: No Transportation Needs   Lack of Transportation (Medical): No   Lack of Transportation (Non-Medical): No  Physical Activity: Insufficiently Active   Days of Exercise per Week: 3 days   Minutes of Exercise per Session: 30 min  Stress: No Stress Concern Present   Feeling of Stress : Not at all  Social Connections: Moderately Isolated   Frequency of Communication with Friends and Family: Twice a week   Frequency of Social Gatherings with Friends and Family: More than three times a week   Attends Religious Services: More than 4 times per year   Active Member of [employer] or Organizations: No   Attends Archivist Meetings: Never   Marital Status: Never married  Human resources officer Violence: Not At Risk   Fear of Current or Ex-Partner: No   Emotionally Abused: No   Physically Abused: No   Sexually Abused: No    Review of Systems:   other review of systems negative except as mentioned in the HPI.  Physical Exam: Vital signs BP (!) 149/78    Pulse 85    Temp 98.6 F (37 C)    Ht 5\' 11"  (1.803 m)    Wt 220 lb (99.8 kg)    SpO2 98%    BMI 30.68  kg/m   General:   Alert,  Well-developed, well-nourished, pleasant and cooperative in NAD Lungs:  Clear throughout to auscultation.   Heart:  Regular rate and rhythm; no murmurs, clicks, rubs,  or gallops. Abdomen:  Soft, nontender and nondistended. Normal bowel sounds.   Neuro/Psych:  Alert and cooperative. Normal  mood and affect. A and O x 3   @Selah Klang  Simonne Maffucci, MD, [facility] [phone] (pager) 07/19/2021 9:29 AM@

## 2021-07-19 NOTE — Progress Notes (Signed)
Report to PACU, RN, vss, BBS= Clear.  

## 2021-07-19 NOTE — Progress Notes (Signed)
Called to room to assist during endoscopic procedure.  Patient ID and intended procedure confirmed with present staff. Received instructions for my participation in the procedure from the performing physician.  

## 2021-07-19 NOTE — Patient Instructions (Addendum)
I found and removed 9 tiny polyps today. I will let you know pathology results and when to have another routine colonoscopy by mail and/or My Chart.  I appreciate the opportunity to care for you. Gatha Mayer, MD, [facility]  Please read handouts provided. Continue present medications. Await pathology results. Resume previous diet.   YOU HAD AN ENDOSCOPIC PROCEDURE TODAY AT [facility]:   Refer to the procedure report that was given to you for any specific questions about what was found during the examination.  If the procedure report does not answer your questions, please call your gastroenterologist to clarify.  If you requested that your care partner not be given the details of your procedure findings, then the procedure report has been included in a sealed envelope for you to review at your convenience later.  YOU SHOULD EXPECT: Some feelings of bloating in the abdomen. Passage of more gas than usual.  Walking can help get rid of the air that was put into your GI tract during the procedure and reduce the bloating. If you had a lower endoscopy (such as a colonoscopy or flexible sigmoidoscopy) you may notice spotting of blood in your stool or on the toilet paper. If you underwent a bowel prep for your procedure, you may not have a normal bowel movement for a few days.  Please Note:  You might notice some irritation and congestion in your nose or some drainage.  This is from the oxygen used during your procedure.  There is no need for concern and it should clear up in a day or so.  SYMPTOMS TO REPORT IMMEDIATELY:  Following lower endoscopy (colonoscopy or flexible sigmoidoscopy):  Excessive amounts of blood in the stool  Significant tenderness or worsening of abdominal pains  Swelling of the abdomen that is new, acute  Fever of 100F or higher   For urgent or emergent issues, a gastroenterologist can be reached at any hour by calling [phone]. Do not use MyChart  messaging for urgent concerns.    DIET:  We do recommend a small meal at first, but then you may proceed to your regular diet.  Drink plenty of fluids but you should avoid alcoholic beverages for 24 hours.  ACTIVITY:  You should plan to take it easy for the rest of today and you should NOT DRIVE or use heavy machinery until tomorrow (because of the sedation medicines used during the test).    FOLLOW UP: Our staff will call the number listed on your records 48-72 hours following your procedure to check on you and address any questions or concerns that you may have regarding the information given to you following your procedure. If we do not reach you, we will leave a message.  We will attempt to reach you two times.  During this call, we will ask if you have developed any symptoms of COVID 19. If you develop any symptoms (ie: fever, flu-like symptoms, shortness of breath, cough etc.) before then, please call [phone].  If you test positive for Covid 19 in the 2 weeks post procedure, please call and report this information to [place].    If any biopsies were taken you will be contacted by phone or by letter within the next 1-3 weeks.  Please call us at [phone] if you have not heard about the biopsies in 3 weeks.    SIGNATURES/CONFIDENTIALITY: You and/or your care partner have signed paperwork which will be entered into your electronic medical record.  These signatures attest to the fact that that the information above on your After Visit Summary has been reviewed and is understood.  Full responsibility of the confidentiality of this discharge information lies with you and/or your care-partner.  °

## 2021-07-19 NOTE — Op Note (Signed)
[place] Patient Name: Thomas Stuart Procedure Date: 07/19/2021 9:26 AM MRN: [id number] Endoscopist: Gatha Mayer , MD Age: 70 Referring MD:  Date of Birth: 27-Aug-1950 Gender: Male Account #: [id number] Procedure:                Colonoscopy Indications:              Surveillance: Personal history of adenomatous                            polyps on last colonoscopy 5 years ago, Last                            colonoscopy: 2017 Medicines:                Propofol per Anesthesia, Monitored Anesthesia Care Procedure:                Pre-Anesthesia Assessment:                           - Prior to the procedure, a History and Physical                            was performed, and patient medications and                            allergies were reviewed. The patient's tolerance of                            previous anesthesia was also reviewed. The risks                            and benefits of the procedure and the sedation                            options and risks were discussed with the patient.                            All questions were answered, and informed consent                            was obtained. Prior Anticoagulants: The patient has                            taken no previous anticoagulant or antiplatelet                            agents. ASA Grade Assessment: II - A patient with                            mild systemic disease. After reviewing the risks                            and benefits, the patient was deemed in  satisfactory condition to undergo the procedure.                           After obtaining informed consent, the colonoscope                            was passed under direct vision. Throughout the                            procedure, the patient's blood pressure, pulse, and                            oxygen saturations were monitored continuously. The                            CF [id number] #[id number] was  introduced through the anus                            and advanced to the the cecum, identified by                            appendiceal orifice and ileocecal valve. The                            colonoscopy was performed without difficulty. The                            patient tolerated the procedure well. The quality                            of the bowel preparation was good. The ileocecal                            valve, appendiceal orifice, and rectum were                            photographed. The bowel preparation used was                            Miralax via split dose instruction. Scope In: 9:41:31 AM Scope Out: 10:02:34 AM Scope Withdrawal Time: 0 hours 18 minutes 21 seconds  Total Procedure Duration: 0 hours 21 minutes 3 seconds  Findings:                 The digital rectal exam findings include surgically                            absent prostate.                           Nine sessile polyps were found in the sigmoid                            colon, descending colon and transverse colon. The  polyps were diminutive in size. These polyps were                            removed with a cold snare. Resection and retrieval                            were complete. Verification of patient                            identification for the specimen was done. Estimated                            blood loss was minimal.                           The exam was otherwise without abnormality on                            direct and retroflexion views. Complications:            No immediate complications. Estimated Blood Loss:     Estimated blood loss was minimal. 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                           - Nine diminutive polyps in the sigmoid colon, in                            the descending colon and in the transverse colon,                             removed with a cold snare. Resected and retrieved.                           - The examination was otherwise normal on direct                            and retroflexion views. Recommendation:           - Patient has a contact number available for                            emergencies. The signs and symptoms of potential                            delayed complications were discussed with the                            patient. Return to normal activities tomorrow.                            Written  discharge instructions were provided to the                            patient.                           - Resume previous diet.                           - Continue present medications.                           - Await pathology results.                           - Repeat colonoscopy is recommended for                            surveillance. The colonoscopy date will be                            determined after pathology results from today's                            exam become available for review. Gatha Mayer, MD 07/19/2021 10:10:44 AM This report has been signed electronically.

## 2021-07-21 ENCOUNTER — Telehealth: Payer: Self-pay | Admitting: *Deleted

## 2021-07-21 NOTE — Telephone Encounter (Signed)
°  Follow up Call-  Call back number 07/19/2021  Post procedure Call Back phone  # [phone]  Permission to leave phone message Yes  Some recent data might be hidden     Patient questions:  Do you have a fever, pain , or abdominal swelling? No. Pain Score  0 *  Have you tolerated food without any problems? Yes.    Have you been able to return to your normal activities? Yes.    Do you have any questions about your discharge instructions: Diet   No. Medications  No. Follow up visit  No.  Do you have questions or concerns about your Care? No.  Actions: * If pain score is 4 or above: No action needed, pain <4.  Have you developed a fever since your procedure? no  2.   Have you had an respiratory symptoms (SOB or cough) since your procedure? no  3.   Have you tested positive for COVID 19 since your procedure no  4.   Have you had any family members/close contacts diagnosed with the COVID 19 since your procedure?  no   If yes to any of these questions please route to Joylene John, RN and Joella Prince, RN

## 2021-07-24 ENCOUNTER — Encounter: Payer: Self-pay | Admitting: Internal Medicine

## 2021-07-28 ENCOUNTER — Telehealth: Payer: Self-pay | Admitting: Internal Medicine

## 2021-07-28 NOTE — Telephone Encounter (Signed)
Pt states that he has a spot on his stomach about the size of a quarter and questions could it have come from his colonoscopy. Pt was notified that this is NOT  something that is related to his recent colonoscopy. Pt denied being bit by a tick or spider. Pt notified to reach out to his PCP for evaluation.  Pt verbalized understanding with all questions answered.

## 2021-07-28 NOTE — Telephone Encounter (Signed)
Patient called stating that he has a round small circle on his stomach and thinks it come from his colonoscopy and the removal of ployps. Seeking advice, please advise.

## 2021-10-02 DIAGNOSIS — H2513 Age-related nuclear cataract, bilateral: Secondary | ICD-10-CM | POA: Diagnosis not present

## 2021-10-02 DIAGNOSIS — E119 Type 2 diabetes mellitus without complications: Secondary | ICD-10-CM | POA: Diagnosis not present

## 2021-11-01 DIAGNOSIS — Z8546 Personal history of malignant neoplasm of prostate: Secondary | ICD-10-CM | POA: Diagnosis not present

## 2021-11-08 DIAGNOSIS — Z8546 Personal history of malignant neoplasm of prostate: Secondary | ICD-10-CM | POA: Diagnosis not present

## 2021-11-08 DIAGNOSIS — N5201 Erectile dysfunction due to arterial insufficiency: Secondary | ICD-10-CM | POA: Diagnosis not present

## 2021-11-29 ENCOUNTER — Other Ambulatory Visit: Payer: Self-pay | Admitting: Family Medicine

## 2021-11-29 DIAGNOSIS — I10 Essential (primary) hypertension: Secondary | ICD-10-CM

## 2021-12-05 ENCOUNTER — Other Ambulatory Visit: Payer: Self-pay | Admitting: Family Medicine

## 2021-12-05 DIAGNOSIS — E785 Hyperlipidemia, unspecified: Secondary | ICD-10-CM

## 2022-02-08 ENCOUNTER — Ambulatory Visit (INDEPENDENT_AMBULATORY_CARE_PROVIDER_SITE_OTHER): Payer: PPO | Admitting: Family Medicine

## 2022-02-08 ENCOUNTER — Encounter: Payer: Self-pay | Admitting: Family Medicine

## 2022-02-08 VITALS — BP 140/88 | HR 86 | Temp 98.0°F | Ht 71.0 in | Wt 231.6 lb

## 2022-02-08 DIAGNOSIS — D170 Benign lipomatous neoplasm of skin and subcutaneous tissue of head, face and neck: Secondary | ICD-10-CM

## 2022-02-08 DIAGNOSIS — E785 Hyperlipidemia, unspecified: Secondary | ICD-10-CM

## 2022-02-08 DIAGNOSIS — R7303 Prediabetes: Secondary | ICD-10-CM

## 2022-02-08 DIAGNOSIS — Z0001 Encounter for general adult medical examination with abnormal findings: Secondary | ICD-10-CM | POA: Diagnosis not present

## 2022-02-08 DIAGNOSIS — M1990 Unspecified osteoarthritis, unspecified site: Secondary | ICD-10-CM | POA: Diagnosis not present

## 2022-02-08 DIAGNOSIS — I10 Essential (primary) hypertension: Secondary | ICD-10-CM

## 2022-02-08 LAB — COMPREHENSIVE METABOLIC PANEL
ALT: 22 U/L (ref 0–53)
AST: 20 U/L (ref 0–37)
Albumin: 4.4 g/dL (ref 3.5–5.2)
Alkaline Phosphatase: 68 U/L (ref 39–117)
BUN: 9 mg/dL (ref 6–23)
CO2: 28 mEq/L (ref 19–32)
Calcium: 9.4 mg/dL (ref 8.4–10.5)
Chloride: 102 mEq/L (ref 96–112)
Creatinine, Ser: 0.79 mg/dL (ref 0.40–1.50)
GFR: 90.39 mL/min (ref >60.00)
Glucose, Bld: 104 mg/dL — ABNORMAL HIGH (ref 70–99)
Potassium: 3.7 mEq/L (ref 3.5–5.1)
Sodium: 138 mEq/L (ref 135–145)
Total Bilirubin: 0.6 mg/dL (ref 0.2–1.2)
Total Protein: 6.8 g/dL (ref 6.0–8.3)

## 2022-02-08 LAB — TSH: TSH: 1.27 u[IU]/mL (ref 0.35–5.50)

## 2022-02-08 LAB — CBC
HCT: 45.3 % (ref 39.0–52.0)
Hemoglobin: 15 g/dL (ref 13.0–17.0)
MCHC: 33 g/dL (ref 30.0–36.0)
MCV: 89.8 fl (ref 78.0–100.0)
Platelets: 281 10*3/uL (ref 150.0–400.0)
RBC: 5.05 Mil/uL (ref 4.22–5.81)
RDW: 13.9 % (ref 11.5–15.5)
WBC: 9.1 10*3/uL (ref 4.0–10.5)

## 2022-02-08 LAB — LIPID PANEL
Cholesterol: 154 mg/dL (ref 0–200)
HDL: 50 mg/dL (ref >39.00)
LDL Cholesterol: 83 mg/dL (ref 0–99)
NonHDL: 103.66
Total CHOL/HDL Ratio: 3
Triglycerides: 103 mg/dL (ref 0.0–149.0)
VLDL: 20.6 mg/dL (ref 0.0–40.0)

## 2022-02-08 LAB — HEMOGLOBIN A1C: Hgb A1c MFr Bld: 6.4 % (ref 4.6–6.5)

## 2022-02-08 MED ORDER — BENAZEPRIL-HYDROCHLOROTHIAZIDE 10-12.5 MG PO TABS: 1.0000 | ORAL_TABLET | Freq: Every day | ORAL | 3 refills | Status: DC

## 2022-02-08 NOTE — Assessment & Plan Note (Addendum)
At goal per JNC-8. Home readings usually in 130s/80s. Will continue benazepril-HCTZ 10-12.'5mg'$  daily. Check labs.

## 2022-02-08 NOTE — Assessment & Plan Note (Signed)
Check lipids.He is on lipitor '10mg'$  daily and tolerating well.

## 2022-02-08 NOTE — Progress Notes (Signed)
Chief Complaint:  Thomas Stuart is a 71 y.o. male who presents today for his annual comprehensive physical exam.    Assessment/Plan:  Chronic Problems Addressed Today: Prediabetes Check A1c. Discussed lifestyle modifications.   Hyperlipidemia Check lipids.He is on lipitor '10mg'$  daily and tolerating well.   Hypertension At goal per JNC-8. Home readings usually in 130s/80s. Will continue benazepril-HCTZ 10-12.'5mg'$  daily. Check labs.   Preventative Healthcare: Has PSA check via urology. UTD on vaccines - will get records.  Check labs today.  Due for next colonoscopy in 3 years.  Patient Counseling(The following topics were reviewed and/or handout was given):  -Nutrition: Stressed importance of moderation in sodium/caffeine intake, saturated fat and cholesterol, caloric balance, sufficient intake of fresh fruits, vegetables, and fiber.  -Stressed the importance of regular exercise.   -Substance Abuse: Discussed cessation/primary prevention of tobacco, alcohol, or other drug use; driving or other dangerous activities under the influence; availability of treatment for abuse.   -Injury prevention: Discussed safety belts, safety helmets, smoke detector, smoking near bedding or upholstery.   -Sexuality: Discussed sexually transmitted diseases, partner selection, use of condoms, avoidance of unintended pregnancy and contraceptive alternatives.   -Dental health: Discussed importance of regular tooth brushing, flossing, and dental visits.  -Health maintenance and immunizations reviewed. Please refer to Health maintenance section.  Return to care in 1 year for next preventative visit.     Subjective:  HPI:  He has no acute complaints today. See A/p for status of chronic conditions.   Lifestyle Diet: None specific.  Exercise: Very busy with work. 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      02/08/2022    8:45 AM  Depression screen PHQ 2/9  Decreased Interest 0  Down, Depressed,  Hopeless 0  PHQ - 2 Score 0    Health Maintenance Due  Topic Date Due   Pneumonia Vaccine 16+ Years old (2 - PPSV23 if available, else PCV20) 05/28/2019   TETANUS/TDAP  10/27/2019     ROS: Per HPI, otherwise a complete review of systems was negative.   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   PSA elevation    Urinary retention    Patient Active Problem List   Diagnosis Date Noted   Prediabetes 03/29/2020   Hyperlipidemia 01/21/2017   Lipoma of neck 02/01/2016   Osteoarthritis 01/31/2016   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   Hypertension 01/31/2016   History of prostate cancer s/p prostatectomy 06/14/2014   Benign prostatic hyperplasia with urinary obstruction 04/15/2014   Hx of adenomatous polyp of colon 03/24/2008   Past Surgical History:  Procedure Laterality Date   APPENDECTOMY     COLONOSCOPY  2004, 03/2008, 06/15/2011, 06/2016   2004 - 6 mm polyp, 2009 4 adenomas max 10 mm 2012 - no polyps 2017 diminutive adenoma   CYSTOSCOPY  2010   with fulgeration   CYSTOSCOPY  2006   w/ stone removal   CYSTOSCOPY N/A 04/15/2014   Procedure: CYSTOSCOPY , WITH PROSTATE  BIOPSY;  Surgeon: Jenny Reichmann  Keene Breath, MD;  Location: [facility];  Service: Urology;  Laterality: N/A;   INGUINAL HERNIA REPAIR  2011   left   LITHOTRIPSY  2008   kidney stone   LYMPHADENECTOMY Bilateral 06/14/2014   Procedure: LYMPHADENECTOMY;  Surgeon: Raynelle Bring, MD;  Location: [facility];  Service: Urology;  Laterality: Bilateral;   POLYPECTOMY     PROSTATE BIOPSY  01/2007   benign, TURP   PROSTATE BIOPSY  N/A 04/15/2014   Procedure: PROSTATE ULTRASOUND/BIOPSY ;  Surgeon: Malka So, MD;  Location: [facility];  Service: Urology;  Laterality: N/A;   PROSTATE SURGERY     ROBOT ASSISTED LAPAROSCOPIC RADICAL PROSTATECTOMY N/A 06/14/2014   Procedure: ROBOTIC ASSISTED LAPAROSCOPIC RADICAL PROSTATECTOMY LEVEL 3;  Surgeon: Raynelle Bring, MD;  Location: [facility];  Service: Urology;  Laterality: N/A;   TONSILLECTOMY     TRANSURETHRAL RESECTION OF PROSTATE  2007    Family History  Problem Relation Age of Onset   Breast cancer Mother 30   Dementia Mother    Transient ischemic attack Mother    Healthy Sister    Healthy Brother    Colon cancer Neg Hx    Esophageal cancer Neg Hx    Rectal cancer Neg Hx    Stomach cancer Neg Hx     Medications- reviewed and updated Current Outpatient Medications  Medication Sig Dispense Refill   atorvastatin (LIPITOR) 10 MG tablet TAKE 1 TABLET BY MOUTH EVERY DAY 90 tablet 1   Coenzyme Q10 (COQ10) 400 MG CAPS Take 1 capsule by mouth daily.     Multiple Vitamin (ONE-A-DAY MENS PO) Take by mouth.     tadalafil (CIALIS) 20 MG tablet Take 20 mg by mouth as directed.     vitamin E 1000 UNIT capsule Take 1,000 Units by mouth daily.     benazepril-hydrochlorthiazide (LOTENSIN HCT) 10-12.5 MG tablet Take 1 tablet by mouth daily. 90 tablet 3   No current facility-administered medications for this visit.    Allergies-reviewed and updated No Known Allergies  Social History   Socioeconomic History   Marital status: Single    Spouse name: Not on file   Number of children: Not on file   Years of education: Not on file   Highest education level: Not on file  Occupational History   Occupation: reired  Tobacco Use   Smoking status: Never   Smokeless tobacco: Never  Vaping Use   Vaping Use: Never used  Substance and Sexual Activity   Alcohol use: No   Drug use: No   Sexual activity: Yes  Other Topics Concern   Not on file  Social History  Narrative   Not on file   Social Determinants of Health   Financial Resource Strain: Low Risk  (04/06/2021)   Overall Financial Resource Strain (CARDIA)    Difficulty of Paying Living Expenses: Not hard at all  Food Insecurity: No Food Insecurity (04/06/2021)   Hunger Vital Sign    Worried About Running Out of Food in the Last Year: Never true    [place] in the Last Year: Never true  Transportation Needs: No Transportation Needs (04/06/2021)   PRAPARE - Hydrologist (Medical): No    Lack of Transportation (Non-Medical): No  Physical Activity: Insufficiently Active (04/06/2021)   Exercise Vital Sign    Days of Exercise per Week: 3 days    Minutes of Exercise per Session: 30 min  Stress: No Stress Concern  Present (04/06/2021)   [place]    Feeling of Stress : Not at all  Social Connections: Moderately Isolated (04/06/2021)   Social Connection and Isolation Panel [NHANES]    Frequency of Communication with Friends and Family: Twice a week    Frequency of Social Gatherings with Friends and Family: More than three times a week    Attends Religious Services: More than 4 times per year    Active Member of [employer] or Organizations: No    Attends Music therapist: Never    Marital Status: Never married        Objective:  Physical Exam: BP 140/88   Pulse 86   Temp 98 F (36.7 C) (Temporal)   Ht '5\' 11"'$  (1.803 m)   Wt 231 lb 9.6 oz (105.1 kg)   SpO2 97%   BMI 32.30 kg/m   Body mass index is 32.3 kg/m. Wt Readings from Last 3 Encounters:  02/08/22 231 lb 9.6 oz (105.1 kg)  07/19/21 220 lb (99.8 kg)  06/28/21 220 lb (99.8 kg)   Gen: NAD, resting comfortably HEENT: TMs normal bilaterally. OP clear. No thyromegaly noted.  CV: RRR with no murmurs appreciated Pulm: NWOB, CTAB with no crackles, wheezes, or rhonchi GI: Normal bowel sounds present. Soft, Nontender,  Nondistended. MSK: no edema, cyanosis, or clubbing noted Skin: warm, dry Neuro: CN2-12 grossly intact. Strength 5/5 in upper and lower extremities. Reflexes symmetric and intact bilaterally.  Psych: Normal affect and thought content     [place]ally Callas M. Jerline Pain, MD 02/08/2022 9:23 AM

## 2022-02-08 NOTE — Assessment & Plan Note (Signed)
Check A1c.  Discussed lifestyle modifications. °

## 2022-02-08 NOTE — Patient Instructions (Signed)
It was very nice to see you today!  We will check blood work today.  Please continue to work on diet and exercise.  We will see back in 1 year for your next physical.  Come back sooner if needed.  Take care, Dr Jerline Pain  PLEASE NOTE:  If you had any lab tests please let us know if you have not heard back within a few days. You may see your results on mychart before we have a chance to review them but we will give you a call once they are reviewed by [place]. If we ordered any referrals today, please let us know if you have not heard from their office within the next week.   Please try these tips to maintain a healthy lifestyle:  Eat at least 3 REAL meals and 1-2 snacks per day.  Aim for no more than 5 hours between eating.  If you eat breakfast, please do so within one hour of getting up.   Each meal should contain half fruits/vegetables, one quarter protein, and one quarter carbs (no bigger than a computer mouse)  Cut down on sweet beverages. This includes juice, soda, and sweet tea.   Drink at least 1 glass of water with each meal and aim for at least 8 glasses per day  Exercise at least 150 minutes every week.    Preventive Care 57 Years and Older, Male Preventive care refers to lifestyle choices and visits with your health care provider that can promote health and wellness. Preventive care visits are also called wellness exams. What can I expect for my preventive care visit? Counseling During your preventive care visit, your health care provider may ask about your: Medical history, including: Past medical problems. Family medical history. History of falls. Current health, including: Emotional well-being. Home life and relationship well-being. Sexual activity. Memory and ability to understand (cognition). Lifestyle, including: Alcohol, nicotine or tobacco, and drug use. Access to firearms. Diet, exercise, and sleep habits. Work and work Statistician. Sunscreen use. Safety  issues such as seatbelt and bike helmet use. Physical exam Your health care provider will check your: Height and weight. These may be used to calculate your BMI (body mass index). BMI is a measurement that tells if you are at a healthy weight. Waist circumference. This measures the distance around your waistline. This measurement also tells if you are at a healthy weight and may help predict your risk of certain diseases, such as type 2 diabetes and high blood pressure. Heart rate and blood pressure. Body temperature. Skin for abnormal spots. What immunizations do I need?  Vaccines are usually given at various ages, according to a schedule. Your health care provider will recommend vaccines for you based on your age, medical history, and lifestyle or other factors, such as travel or where you work. What tests do I need? Screening Your health care provider may recommend screening tests for certain conditions. This may include: Lipid and cholesterol levels. Diabetes screening. This is done by checking your blood sugar (glucose) after you have not eaten for a while (fasting). Hepatitis C test. Hepatitis B test. HIV (human immunodeficiency virus) test. STI (sexually transmitted infection) testing, if you are at risk. Lung cancer screening. Colorectal cancer screening. Prostate cancer screening. Abdominal aortic aneurysm (AAA) screening. You may need this if you are a current or former smoker. Talk with your health care provider about your test results, treatment options, and if necessary, the need for more tests. Follow these instructions at home: Eating  and drinking  Eat a diet that includes fresh fruits and vegetables, whole grains, lean protein, and low-fat dairy products. Limit your intake of foods with high amounts of sugar, saturated fats, and salt. Take vitamin and mineral supplements as recommended by your health care provider. Do not drink alcohol if your health care provider tells  you not to drink. If you drink alcohol: Limit how much you have to 0-2 drinks a day. Know how much alcohol is in your drink. In the [place] one drink equals one 12 oz bottle of beer (355 mL), one 5 oz glass of wine (148 mL), or one 1 oz glass of hard liquor (44 mL). Lifestyle Brush your teeth every morning and night with fluoride toothpaste. Floss one time each day. Exercise for at least 30 minutes 5 or more days each week. Do not use any products that contain nicotine or tobacco. These products include cigarettes, chewing tobacco, and vaping devices, such as e-cigarettes. If you need help quitting, ask your health care provider. Do not use drugs. If you are sexually active, practice safe sex. Use a condom or other form of protection to prevent STIs. Take aspirin only as told by your health care provider. Make sure that you understand how much to take and what form to take. Work with your health care provider to find out whether it is safe and beneficial for you to take aspirin daily. Ask your health care provider if you need to take a cholesterol-lowering medicine (statin). Find healthy ways to manage stress, such as: Meditation, yoga, or listening to music. Journaling. Talking to a trusted person. Spending time with friends and family. Safety Always wear your seat belt while driving or riding in a vehicle. Do not drive: If you have been drinking alcohol. Do not ride with someone who has been drinking. When you are tired or distracted. While texting. If you have been using any mind-altering substances or drugs. Wear a helmet and other protective equipment during sports activities. If you have firearms in your house, make sure you follow all gun safety procedures. Minimize exposure to UV radiation to reduce your risk of skin cancer. What's next? Visit your health care provider once a year for an annual wellness visit. Ask your health care provider how often you should have your eyes and  teeth checked. Stay up to date on all vaccines. This information is not intended to replace advice given to you by your health care provider. Make sure you discuss any questions you have with your health care provider. Document Revised: 01/18/2021 Document Reviewed: 01/18/2021 Elsevier Patient Education  [place].

## 2022-02-09 NOTE — Progress Notes (Signed)
Please inform patient of the following:  Labs are all STABLE.  Do not need to make any changes to his treatment plan at this time.  He should continue to work on diet and exercise and we can recheck in a year.

## 2022-02-12 ENCOUNTER — Telehealth: Payer: Self-pay | Admitting: Family Medicine

## 2022-02-12 NOTE — Telephone Encounter (Signed)
Patient is requesting call back with lab results.  °

## 2022-02-13 NOTE — Telephone Encounter (Signed)
See results note. 

## 2022-04-16 ENCOUNTER — Ambulatory Visit (INDEPENDENT_AMBULATORY_CARE_PROVIDER_SITE_OTHER): Payer: PPO

## 2022-04-16 VITALS — BP 140/78 | HR 80 | Temp 98.2°F | Wt 225.0 lb

## 2022-04-16 DIAGNOSIS — Z0000 Encounter for general adult medical examination without abnormal findings: Secondary | ICD-10-CM

## 2022-04-16 NOTE — Progress Notes (Addendum)
Subjective:   Thomas Stuart is a 71 y.o. male who presents for Medicare Annual/Subsequent preventive examination.  Review of Systems     Cardiac Risk Factors include: advanced age (>7mn, >>69women);dyslipidemia;hypertension;male gender;obesity (BMI >30kg/m2)     Objective:    Today's Vitals   04/16/22 0857  BP: (!) 140/78  Pulse: 80  Temp: 98.2 F (36.8 C)  SpO2: 96%  Weight: 225 lb (102.1 kg)   Body mass index is 31.38 kg/m.     04/16/2022    9:04 AM 04/06/2021    3:27 PM 03/29/2020    9:40 AM 11/20/2018   10:51 AM 06/25/2016    3:37 PM 06/16/2014   12:44 PM 06/14/2014    4:15 PM  Advanced Directives  Does Patient Have a Medical Advance Directive? No No No No No No No  Does patient want to make changes to medical advance directive?  Yes (MAU/Ambulatory/Procedural Areas - Information given)       Would patient like information on creating a medical advance directive? No - Patient declined  No - Patient declined Yes (MAU/Ambulatory/Procedural Areas - Information given) No - patient declined information No - patient declined information No - patient declined information    Current Medications (verified) Outpatient Encounter Medications as of 04/16/2022  Medication Sig   atorvastatin (LIPITOR) 10 MG tablet TAKE 1 TABLET BY MOUTH EVERY DAY   benazepril-hydrochlorthiazide (LOTENSIN HCT) 10-12.5 MG tablet Take 1 tablet by mouth daily.   Coenzyme Q10 (COQ10) 400 MG CAPS Take 1 capsule by mouth daily.   Multiple Vitamin (ONE-A-DAY MENS PO) Take by mouth.   tadalafil (CIALIS) 20 MG tablet Take 20 mg by mouth as directed.   VITAMIN D PO Take by mouth.   vitamin E 1000 UNIT capsule Take 1,000 Units by mouth daily.   No facility-administered encounter medications on file as of 04/16/2022.    Allergies (verified) Patient has no known allergies.   History: Past Medical History:  Diagnosis Date   Arthritis    lower back   BPH (benign prostatic hyperplasia)    Cataract     Erectile dysfunction    Hematuria    Hyperlipidemia    Hypertension    Lumbar disc disease    Nephrolithiasis    Personal history of colonic polyps 2004, 2009, 2017   adenomas   [place] 04[place]   Gleason 4+3=7, volume 70 mL   PSA elevation    Urinary retention    Past Surgical History:  Procedure Laterality Date   APPENDECTOMY     COLONOSCOPY  2004, 03/2008, 06/15/2011, 06/2016   2004 - 6 mm polyp, 2009 4 adenomas max 10 mm 2012 - no polyps 2017 diminutive adenoma   CYSTOSCOPY  2010   with fulgeration   CYSTOSCOPY  2006   w/ stone removal   CYSTOSCOPY N/A 04/15/2014   Procedure: CYSTOSCOPY , WITH PROSTATE  BIOPSY;  Surgeon: JMalka So MD;  [facility]  Service: Urology;  Laterality: N/A;   INGUINAL HERNIA REPAIR  2011   left   LITHOTRIPSY  2008   kidney stone   LYMPHADENECTOMY Bilateral 06/14/2014   Procedure: LYMPHADENECTOMY;  Surgeon: LRaynelle Bring MD;  Location: [facility];  Service: Urology;  Laterality: Bilateral;   POLYPECTOMY     PROSTATE BIOPSY  01/2007   benign, TURP   PROSTATE BIOPSY N/A 04/15/2014   Procedure: PROSTATE ULTRASOUND/BIOPSY ;  Surgeon: JMalka So MD;  [facility]  Service: Urology;  Laterality: N/A;   PROSTATE SURGERY     ROBOT ASSISTED LAPAROSCOPIC RADICAL PROSTATECTOMY N/A 06/14/2014   Procedure: ROBOTIC ASSISTED LAPAROSCOPIC RADICAL PROSTATECTOMY LEVEL 3;  Surgeon: Raynelle Bring, MD;  Location: [facility];  Service: Urology;  Laterality: N/A;   TONSILLECTOMY     TRANSURETHRAL RESECTION OF PROSTATE  2007   Family History  Problem Relation Age of Onset   Breast cancer Mother 56   Dementia Mother    Transient ischemic attack Mother    Healthy Sister    Healthy Brother    Colon cancer Neg Hx    Esophageal cancer Neg Hx    Rectal cancer Neg Hx    Stomach cancer Neg Hx    Social History   Socioeconomic History   Marital status: Single    Spouse name: Not on file   Number of  children: Not on file   Years of education: Not on file   Highest education level: Not on file  Occupational History   Occupation: reired  Tobacco Use   Smoking status: Never   Smokeless tobacco: Never  Vaping Use   Vaping Use: Never used  Substance and Sexual Activity   Alcohol use: No   Drug use: No   Sexual activity: Yes  Other Topics Concern   Not on file  Social History Narrative   Not on file   Social Determinants of Health   Financial Resource Strain: Low Risk  (04/16/2022)   Overall Financial Resource Strain (CARDIA)    Difficulty of Paying Living Expenses: Not hard at all  Food Insecurity: No Food Insecurity (04/16/2022)   Hunger Vital Sign    Worried About Running Out of Food in the Last Year: Never true    Ran Out of Food in the Last Year: Never true  Transportation Needs: No Transportation Needs (04/06/2021)   PRAPARE - Hydrologist (Medical): No    Lack of Transportation (Non-Medical): No  Physical Activity: Insufficiently Active (04/16/2022)   Exercise Vital Sign    Days of Exercise per Week: 3 days    Minutes of Exercise per Session: 30 min  Stress: No Stress Concern Present (04/16/2022)   Orr    Feeling of Stress : Not at all  Social Connections: Unknown (04/16/2022)   Social Connection and Isolation Panel [NHANES]    Frequency of Communication with Friends and Family: More than three times a week    Frequency of Social Gatherings with Friends and Family: More than three times a week    Attends Religious Services: Not on Advertising copywriter or Organizations: Not on file    Attends Archivist Meetings: Never    Marital Status: Never married    Tobacco Counseling Counseling given: Not Answered   Clinical Intake:  Pre-visit preparation completed: Yes  Pain : No/denies pain     BMI - recorded: 31.38 Nutritional Status: BMI > 30   Obese Nutritional Risks: None Diabetes: No  How often do you need to have someone help you when you read instructions, pamphlets, or other written materials from your doctor or pharmacy?: 1 - Never  Diabetic?no  Interpreter Needed?: No  Information entered by :: Charlott Rakes, LPN   Activities of Daily Living    04/16/2022    9:07 AM  In your present state of health, do you have any difficulty performing the following activities:  Hearing?  0  Vision? 0  Difficulty concentrating or making decisions? 0  Walking or climbing stairs? 0  Dressing or bathing? 0  Doing errands, shopping? 0  Preparing Food and eating ? N  Using the Toilet? N  In the past six months, have you accidently leaked urine? N  Do you have problems with loss of bowel control? N  Managing your Medications? N  Managing your Finances? N  Housekeeping or managing your Housekeeping? N    Patient Care Team: Vivi Barrack, MD as PCP - General (Family Medicine) Raynelle Bring, MD as Consulting Physician (Urology) Otelia Sergeant, OD as Referring Physician (Ophthalmology)  Indicate any recent Medical Services you may have received from other than [facility] providers in the past year (date may be approximate).     Assessment:   This is a routine wellness examination for Josef.  Hearing/Vision screen Hearing Screening - Comments:: Pt denies any hearing  Vision Screening - Comments:: Pt follows up with Dr Oswaldo Conroy for annual eye exams   Dietary issues and exercise activities discussed: Current Exercise Habits: Home exercise routine, Type of exercise: Other - see comments (ride bike), Time (Minutes): 30, Frequency (Times/Week): 3, Weekly Exercise (Minutes/Week): 90   Goals Addressed             This Visit's Progress    Patient Stated       Lose weight 25 lbs        Depression Screen    04/16/2022    9:03 AM 02/08/2022    8:45 AM 02/08/2022    8:44 AM 04/06/2021    3:26 PM 03/29/2020    9:43 AM 11/20/2018    10:52 AM 01/22/2018    8:39 AM  PHQ 2/9 Scores  PHQ - 2 Score 0 0 0 0 0 0 0    Fall Risk    04/16/2022    9:06 AM 02/08/2022    8:44 AM 04/06/2021    3:29 PM 03/29/2020    9:42 AM 11/20/2018   10:52 AM  Fall Risk   Falls in the past year? 0  0 0 0  Number falls in past yr: 0 0 0 0 0  Injury with Fall? 0 0 0 0 0  Risk for fall due to : Impaired vision No Fall Risks Impaired vision    Follow up Falls prevention discussed  Falls prevention discussed Falls prevention discussed Falls evaluation completed    FALL RISK PREVENTION PERTAINING TO THE HOME:  Any stairs in or around the home? Yes  If so, are there any without handrails? No  Home free of loose throw rugs in walkways, pet beds, electrical cords, etc? Yes  Adequate lighting in your home to reduce risk of falls? Yes   ASSISTIVE DEVICES UTILIZED TO PREVENT FALLS:  Life alert? No  Use of a cane, walker or w/c? No  Grab bars in the bathroom? No  Shower chair or bench in shower? No  Elevated toilet seat or a handicapped toilet? No   TIMED UP AND GO:  Was the test performed? Yes .  Length of time to ambulate 10 feet: 10 sec.   Gait steady and fast without use of assistive device  Cognitive Function:    11/20/2018    1:23 PM  MMSE - Mini Mental State Exam  Orientation to time 5  Orientation to Place 5  Registration 3  Attention/ Calculation 5  Recall 2  Language- name 2 objects 2  Language- repeat 1  Language-  follow 3 step command 3  Language- read & follow direction 1  Write a sentence 1  Copy design 1  Total score 29        04/16/2022    9:08 AM 04/06/2021    3:32 PM  6CIT Screen  What Year? 0 points 0 points  What month? 0 points 0 points  What time? 0 points 0 points  Count back from 20 0 points 0 points  Months in reverse 0 points 0 points  Repeat phrase 0 points 0 points  Total Score 0 points 0 points    Immunizations Immunization History  Administered Date(s) Administered   Fluad Quad(high Dose  65+) 05/13/2019   Influenza, High Dose Seasonal PF 04/29/2017, 05/27/2018   Influenza,inj,Quad PF,6+ Mos 06/06/2016, 05/15/2017   Influenza-Unspecified 06/06/2016   PFIZER(Purple Top)SARS-COV-2 Vaccination 10/02/2019, 10/28/2019, 05/10/2020, 12/04/2020   Pfizer Covid-19 Vaccine Bivalent Booster 83yr & up 12/07/2021   Pneumococcal Conjugate-13 08/15/2017, 05/27/2018   Tdap 10/26/2009   Zoster Recombinat (Shingrix) 09/18/2018, 01/05/2019    TDAP status: Due, Education has been provided regarding the importance of this vaccine. Advised may receive this vaccine at local pharmacy or Health Dept. Aware to provide a copy of the vaccination record if obtained from local pharmacy or Health Dept. Verbalized acceptance and understanding.  Flu Vaccine status: Due, Education has been provided regarding the importance of this vaccine. Advised may receive this vaccine at local pharmacy or Health Dept. Aware to provide a copy of the vaccination record if obtained from local pharmacy or Health Dept. Verbalized acceptance and understanding.  Pneumococcal vaccine status: Due, Education has been provided regarding the importance of this vaccine. Advised may receive this vaccine at local pharmacy or Health Dept. Aware to provide a copy of the vaccination record if obtained from local pharmacy or Health Dept. Verbalized acceptance and understanding.  Covid-19 vaccine status: Completed vaccines  Qualifies for Shingles Vaccine? Yes   Zostavax completed Yes   Shingrix Completed?: Yes  Screening Tests Health Maintenance  Topic Date Due   Pneumonia Vaccine 70 Years old (2 - PPSV23 or PCV20) 05/28/2019   TETANUS/TDAP  10/27/2019   COVID-19 Vaccine (6 - Pfizer risk series) 02/01/2022   INFLUENZA VACCINE  03/06/2022   COLONOSCOPY (Pts 45-420yrInsurance coverage will need to be confirmed)  07/19/2024   Hepatitis C Screening  Completed   Zoster Vaccines- Shingrix  Completed   HPV VACCINES  Aged Out    Health  Maintenance  Health Maintenance Due  Topic Date Due   Pneumonia Vaccine 6540Years old (2 - PPSV23 or PCV20) 05/28/2019   TETANUS/TDAP  10/27/2019   COVID-19 Vaccine (6 - Pfizer risk series) 02/01/2022   INFLUENZA VACCINE  03/06/2022    Colorectal cancer screening: Type of screening: Colonoscopy. Completed 07/19/21. Repeat every 3 years    Additional Screening:  Hepatitis C Screening:  Completed 09/15/18  Vision Screening: Recommended annual ophthalmology exams for early detection of glaucoma and other disorders of the eye. Is the patient up to date with their annual eye exam?  Yes  Who is the provider or what is the name of the office in which the patient attends annual eye exams? Dr BaOswaldo ConroyIf pt is not established with a provider, would they like to be referred to a provider to establish care? No .   Dental Screening: Recommended annual dental exams for proper oral hygiene  Community Resource Referral / Chronic Care Management: CRR required this visit?  No   CCM required this  visit?  No      Plan:     I have personally reviewed and noted the following in the patient's chart:   Medical and social history Use of alcohol, tobacco or illicit drugs  Current medications and supplements including opioid prescriptions. Patient is not currently taking opioid prescriptions. Functional ability and status Nutritional status Physical activity Advanced directives List of other physicians Hospitalizations, surgeries, and ER visits in previous 12 months Vitals Screenings to include cognitive, depression, and falls Referrals and appointments  In addition, I have reviewed and discussed with patient certain preventive protocols, quality metrics, and best practice recommendations. A written personalized care plan for preventive services as well as general preventive health recommendations were provided to patient.     Willette Brace, LPN   [id number]   Nurse Notes: none

## 2022-04-16 NOTE — Patient Instructions (Signed)
[name] , Thank you for taking time to come for your Medicare Wellness Visit. I appreciate your ongoing commitment to your health goals. Please review the following plan we discussed and let me know if I can assist you in the future.   Screening recommendations/referrals: Colonoscopy: done 07/19/21 repeat every 3 years  Recommended yearly ophthalmology/optometry visit for glaucoma screening and checkup Recommended yearly dental visit for hygiene and checkup  Vaccinations: Influenza vaccine: due and discussed  Pneumococcal vaccine: discussed  Tdap vaccine: due and discussed  Shingles vaccine: completed 2/13, 01/05/19    Covid-19: completed 2/26, 3/24, 05/10/20 12/04/20 & 12/07/21  Advanced directives: Advance directive discussed with you today. I have provided a copy for you to complete at home and have notarized. Once this is complete please bring a copy in to our office so we can scan it into your chart.  Conditions/risks identified: get 25 lbs down   Next appointment: Follow up in one year for your annual wellness visit.   Preventive Care 19 Years and Older, Male Preventive care refers to lifestyle choices and visits with your health care provider that can promote health and wellness. What does preventive care include? A yearly physical exam. This is also called an annual well check. Dental exams once or twice a year. Routine eye exams. Ask your health care provider how often you should have your eyes checked. Personal lifestyle choices, including: Daily care of your teeth and gums. Regular physical activity. Eating a healthy diet. Avoiding tobacco and drug use. Limiting alcohol use. Practicing safe sex. Taking low doses of aspirin every day. Taking vitamin and mineral supplements as recommended by your health care provider. What happens during an annual well check? The services and screenings done by your health care provider during your annual well check will depend on your age,  overall health, lifestyle risk factors, and family history of disease. Counseling  Your health care provider may ask you questions about your: Alcohol use. Tobacco use. Drug use. Emotional well-being. Home and relationship well-being. Sexual activity. Eating habits. History of falls. Memory and ability to understand (cognition). Work and work Statistician. Screening  You may have the following tests or measurements: Height, weight, and BMI. Blood pressure. Lipid and cholesterol levels. These may be checked every 5 years, or more frequently if you are over 41 years old. Skin check. Lung cancer screening. You may have this screening every year starting at age 56 if you have a 30-pack-year history of smoking and currently smoke or have quit within the past 15 years. Fecal occult blood test (FOBT) of the stool. You may have this test every year starting at age 44. Flexible sigmoidoscopy or colonoscopy. You may have a sigmoidoscopy every 5 years or a colonoscopy every 10 years starting at age 58. Prostate cancer screening. Recommendations will vary depending on your family history and other risks. Hepatitis C blood test. Hepatitis B blood test. Sexually transmitted disease (STD) testing. Diabetes screening. This is done by checking your blood sugar (glucose) after you have not eaten for a while (fasting). You may have this done every 1-3 years. Abdominal aortic aneurysm (AAA) screening. You may need this if you are a current or former smoker. Osteoporosis. You may be screened starting at age 45 if you are at high risk. Talk with your health care provider about your test results, treatment options, and if necessary, the need for more tests. Vaccines  Your health care provider may recommend certain vaccines, such as: Influenza vaccine. This  is recommended every year. Tetanus, diphtheria, and acellular pertussis (Tdap, Td) vaccine. You may need a Td booster every 10 years. Zoster vaccine.  You may need this after age 2. Pneumococcal 13-valent conjugate (PCV13) vaccine. One dose is recommended after age 35. Pneumococcal polysaccharide (PPSV23) vaccine. One dose is recommended after age 39. Talk to your health care provider about which screenings and vaccines you need and how often you need them. This information is not intended to replace advice given to you by your health care provider. Make sure you discuss any questions you have with your health care provider. Document Released: 08/19/2015 Document Revised: 04/11/2016 Document Reviewed: 05/24/2015 Elsevier Interactive Patient Education  2017 [place] Prevention in the Home Falls can cause injuries. They can happen to people of all ages. There are many things you can do to make your home safe and to help prevent falls. What can I do on the outside of my home? Regularly fix the edges of walkways and driveways and fix any cracks. Remove anything that might make you trip as you walk through a door, such as a raised step or threshold. Trim any bushes or trees on the path to your home. Use bright outdoor lighting. Clear any walking paths of anything that might make someone trip, such as rocks or tools. Regularly check to see if handrails are loose or broken. Make sure that both sides of any steps have handrails. Any raised decks and porches should have guardrails on the edges. Have any leaves, snow, or ice cleared regularly. Use sand or salt on walking paths during winter. Clean up any spills in your garage right away. This includes oil or grease spills. What can I do in the bathroom? Use night lights. Install grab bars by the toilet and in the tub and shower. Do not use towel bars as grab bars. Use non-skid mats or decals in the tub or shower. If you need to sit down in the shower, use a plastic, non-slip stool. Keep the floor dry. Clean up any water that spills on the floor as soon as it happens. Remove soap  buildup in the tub or shower regularly. Attach bath mats securely with double-sided non-slip rug tape. Do not have throw rugs and other things on the floor that can make you trip. What can I do in the bedroom? Use night lights. Make sure that you have a light by your bed that is easy to reach. Do not use any sheets or blankets that are too big for your bed. They should not hang down onto the floor. Have a firm chair that has side arms. You can use this for support while you get dressed. Do not have throw rugs and other things on the floor that can make you trip. What can I do in the kitchen? Clean up any spills right away. Avoid walking on wet floors. Keep items that you use a lot in easy-to-reach places. If you need to reach something above you, use a strong step stool that has a grab bar. Keep electrical cords out of the way. Do not use floor polish or wax that makes floors slippery. If you must use wax, use non-skid floor wax. Do not have throw rugs and other things on the floor that can make you trip. What can I do with my stairs? Do not leave any items on the stairs. Make sure that there are handrails on both sides of the stairs and use them. Fix handrails that  are broken or loose. Make sure that handrails are as long as the stairways. Check any carpeting to make sure that it is firmly attached to the stairs. Fix any carpet that is loose or worn. Avoid having throw rugs at the top or bottom of the stairs. If you do have throw rugs, attach them to the floor with carpet tape. Make sure that you have a light switch at the top of the stairs and the bottom of the stairs. If you do not have them, ask someone to add them for you. What else can I do to help prevent falls? Wear shoes that: Do not have high heels. Have rubber bottoms. Are comfortable and fit you well. Are closed at the toe. Do not wear sandals. If you use a stepladder: Make sure that it is fully opened. Do not climb a closed  stepladder. Make sure that both sides of the stepladder are locked into place. Ask someone to hold it for you, if possible. Clearly mark and make sure that you can see: Any grab bars or handrails. First and last steps. Where the edge of each step is. Use tools that help you move around (mobility aids) if they are needed. These include: Canes. Walkers. Scooters. Crutches. Turn on the lights when you go into a dark area. Replace any light bulbs as soon as they burn out. Set up your furniture so you have a clear path. Avoid moving your furniture around. If any of your floors are uneven, fix them. If there are any pets around you, be aware of where they are. Review your medicines with your doctor. Some medicines can make you feel dizzy. This can increase your chance of falling. Ask your doctor what other things that you can do to help prevent falls. This information is not intended to replace advice given to you by your health care provider. Make sure you discuss any questions you have with your health care provider. Document Released: 05/19/2009 Document Revised: 12/29/2015 Document Reviewed: 08/27/2014 Elsevier Interactive Patient Education  2017 [employer].

## 2022-04-30 ENCOUNTER — Encounter: Payer: Self-pay | Admitting: *Deleted

## 2022-05-31 ENCOUNTER — Other Ambulatory Visit: Payer: Self-pay | Admitting: Family Medicine

## 2022-05-31 DIAGNOSIS — E785 Hyperlipidemia, unspecified: Secondary | ICD-10-CM

## 2022-10-03 DIAGNOSIS — E119 Type 2 diabetes mellitus without complications: Secondary | ICD-10-CM | POA: Diagnosis not present

## 2022-10-03 DIAGNOSIS — H35371 Puckering of macula, right eye: Secondary | ICD-10-CM | POA: Diagnosis not present

## 2022-10-03 DIAGNOSIS — H2513 Age-related nuclear cataract, bilateral: Secondary | ICD-10-CM | POA: Diagnosis not present

## 2022-10-03 LAB — HM DIABETES EYE EXAM

## 2022-10-31 DIAGNOSIS — Z8546 Personal history of malignant neoplasm of prostate: Secondary | ICD-10-CM | POA: Diagnosis not present

## 2022-11-16 ENCOUNTER — Other Ambulatory Visit: Payer: Self-pay | Admitting: Family Medicine

## 2022-11-16 DIAGNOSIS — E785 Hyperlipidemia, unspecified: Secondary | ICD-10-CM

## 2022-11-21 DIAGNOSIS — N5201 Erectile dysfunction due to arterial insufficiency: Secondary | ICD-10-CM | POA: Diagnosis not present

## 2023-02-12 ENCOUNTER — Ambulatory Visit (INDEPENDENT_AMBULATORY_CARE_PROVIDER_SITE_OTHER): Payer: PPO | Admitting: Family Medicine

## 2023-02-12 ENCOUNTER — Encounter: Payer: Self-pay | Admitting: Family Medicine

## 2023-02-12 VITALS — BP 117/83 | HR 89 | Temp 97.8°F | Ht 71.0 in | Wt 237.0 lb

## 2023-02-12 DIAGNOSIS — R7303 Prediabetes: Secondary | ICD-10-CM

## 2023-02-12 DIAGNOSIS — I10 Essential (primary) hypertension: Secondary | ICD-10-CM

## 2023-02-12 DIAGNOSIS — Z0001 Encounter for general adult medical examination with abnormal findings: Secondary | ICD-10-CM

## 2023-02-12 DIAGNOSIS — Z8546 Personal history of malignant neoplasm of prostate: Secondary | ICD-10-CM | POA: Diagnosis not present

## 2023-02-12 DIAGNOSIS — E785 Hyperlipidemia, unspecified: Secondary | ICD-10-CM | POA: Diagnosis not present

## 2023-02-12 LAB — COMPREHENSIVE METABOLIC PANEL
ALT: 27 U/L (ref 0–53)
AST: 22 U/L (ref 0–37)
Albumin: 4.6 g/dL (ref 3.5–5.2)
Alkaline Phosphatase: 80 U/L (ref 39–117)
BUN: 11 mg/dL (ref 6–23)
CO2: 27 mEq/L (ref 19–32)
Calcium: 9.8 mg/dL (ref 8.4–10.5)
Chloride: 101 mEq/L (ref 96–112)
Creatinine, Ser: 0.85 mg/dL (ref 0.40–1.50)
GFR: 87.79 mL/min (ref >60.00)
Glucose, Bld: 122 mg/dL — ABNORMAL HIGH (ref 70–99)
Potassium: 3.7 mEq/L (ref 3.5–5.1)
Sodium: 138 mEq/L (ref 135–145)
Total Bilirubin: 0.8 mg/dL (ref 0.2–1.2)
Total Protein: 7.1 g/dL (ref 6.0–8.3)

## 2023-02-12 LAB — LIPID PANEL
Cholesterol: 177 mg/dL (ref 0–200)
HDL: 49.9 mg/dL (ref >39.00)
LDL Cholesterol: 103 mg/dL — ABNORMAL HIGH (ref 0–99)
NonHDL: 126.75
Total CHOL/HDL Ratio: 4
Triglycerides: 120 mg/dL (ref 0.0–149.0)
VLDL: 24 mg/dL (ref 0.0–40.0)

## 2023-02-12 LAB — CBC
HCT: 48.9 % (ref 39.0–52.0)
Hemoglobin: 16 g/dL (ref 13.0–17.0)
MCHC: 32.6 g/dL (ref 30.0–36.0)
MCV: 91.2 fl (ref 78.0–100.0)
Platelets: 313 10*3/uL (ref 150.0–400.0)
RBC: 5.36 Mil/uL (ref 4.22–5.81)
RDW: 14 % (ref 11.5–15.5)
WBC: 10.3 10*3/uL (ref 4.0–10.5)

## 2023-02-12 LAB — HEMOGLOBIN A1C: Hgb A1c MFr Bld: 6.5 % (ref 4.6–6.5)

## 2023-02-12 LAB — TSH: TSH: 1.3 u[IU]/mL (ref 0.35–5.50)

## 2023-02-12 MED ORDER — BENAZEPRIL-HYDROCHLOROTHIAZIDE 10-12.5 MG PO TABS: 1.0000 | ORAL_TABLET | Freq: Every day | ORAL | 3 refills | Status: DC

## 2023-02-12 NOTE — Patient Instructions (Signed)
It was very nice to see you today!  We will check blood work.  Please continue to work on diet and exercise.  Please cut down on sugar.  Return in about 1 year (around 02/12/2024) for Annual Physical.   Take care, Dr Jimmey Ralph  PLEASE NOTE:  If you had any lab tests, please let us know if you have not heard back within a few days. You may see your results on mychart before we have a chance to review them but we will give you a call once they are reviewed by [place].   If we ordered any referrals today, please let us know if you have not heard from their office within the next week.   If you had any urgent prescriptions sent in today, please check with the pharmacy within an hour of our visit to make sure the prescription was transmitted appropriately.   Please try these tips to maintain a healthy lifestyle:  Eat at least 3 REAL meals and 1-2 snacks per day.  Aim for no more than 5 hours between eating.  If you eat breakfast, please do so within one hour of getting up.   Each meal should contain half fruits/vegetables, one quarter protein, and one quarter carbs (no bigger than a computer mouse)  Cut down on sweet beverages. This includes juice, soda, and sweet tea.   Drink at least 1 glass of water with each meal and aim for at least 8 glasses per day  Exercise at least 150 minutes every week.     Preventive Care 78 Years and Older, Male Preventive care refers to lifestyle choices and visits with your health care provider that can promote health and wellness. Preventive care visits are also called wellness exams. What can I expect for my preventive care visit? Counseling During your preventive care visit, your health care provider may ask about your: Medical history, including: Past medical problems. Family medical history. History of falls. Current health, including: Emotional well-being. Home life and relationship well-being. Sexual activity. Memory and ability to understand  (cognition). Lifestyle, including: Alcohol, nicotine or tobacco, and drug use. Access to firearms. Diet, exercise, and sleep habits. Work and work Astronomer. Sunscreen use. Safety issues such as seatbelt and bike helmet use. Physical exam Your health care provider will check your: Height and weight. These may be used to calculate your BMI (body mass index). BMI is a measurement that tells if you are at a healthy weight. Waist circumference. This measures the distance around your waistline. This measurement also tells if you are at a healthy weight and may help predict your risk of certain diseases, such as type 2 diabetes and high blood pressure. Heart rate and blood pressure. Body temperature. Skin for abnormal spots. What immunizations do I need?  Vaccines are usually given at various ages, according to a schedule. Your health care provider will recommend vaccines for you based on your age, medical history, and lifestyle or other factors, such as travel or where you work. What tests do I need? Screening Your health care provider may recommend screening tests for certain conditions. This may include: Lipid and cholesterol levels. Diabetes screening. This is done by checking your blood sugar (glucose) after you have not eaten for a while (fasting). Hepatitis C test. Hepatitis B test. HIV (human immunodeficiency virus) test. STI (sexually transmitted infection) testing, if you are at risk. Lung cancer screening. Colorectal cancer screening. Prostate cancer screening. Abdominal aortic aneurysm (AAA) screening. You may need this if you  are a current or former smoker. Talk with your health care provider about your test results, treatment options, and if necessary, the need for more tests. Follow these instructions at home: Eating and drinking  Eat a diet that includes fresh fruits and vegetables, whole grains, lean protein, and low-fat dairy products. Limit your intake of foods with  high amounts of sugar, saturated fats, and salt. Take vitamin and mineral supplements as recommended by your health care provider. Do not drink alcohol if your health care provider tells you not to drink. If you drink alcohol: Limit how much you have to 0-2 drinks a day. Know how much alcohol is in your drink. In the [place] one drink equals one 12 oz bottle of beer (355 mL), one 5 oz glass of wine (148 mL), or one 1 oz glass of hard liquor (44 mL). Lifestyle Brush your teeth every morning and night with fluoride toothpaste. Floss one time each day. Exercise for at least 30 minutes 5 or more days each week. Do not use any products that contain nicotine or tobacco. These products include cigarettes, chewing tobacco, and vaping devices, such as e-cigarettes. If you need help quitting, ask your health care provider. Do not use drugs. If you are sexually active, practice safe sex. Use a condom or other form of protection to prevent STIs. Take aspirin only as told by your health care provider. Make sure that you understand how much to take and what form to take. Work with your health care provider to find out whether it is safe and beneficial for you to take aspirin daily. Ask your health care provider if you need to take a cholesterol-lowering medicine (statin). Find healthy ways to manage stress, such as: Meditation, yoga, or listening to music. Journaling. Talking to a trusted person. Spending time with friends and family. Safety Always wear your seat belt while driving or riding in a vehicle. Do not drive: If you have been drinking alcohol. Do not ride with someone who has been drinking. When you are tired or distracted. While texting. If you have been using any mind-altering substances or drugs. Wear a helmet and other protective equipment during sports activities. If you have firearms in your house, make sure you follow all gun safety procedures. Minimize exposure to UV radiation to  reduce your risk of skin cancer. What's next? Visit your health care provider once a year for an annual wellness visit. Ask your health care provider how often you should have your eyes and teeth checked. Stay up to date on all vaccines. This information is not intended to replace advice given to you by your health care provider. Make sure you discuss any questions you have with your health care provider. Document Revised: 01/18/2021 Document Reviewed: 01/18/2021 Elsevier Patient Education  2024 [employer].

## 2023-02-12 NOTE — Assessment & Plan Note (Signed)
Follows with urology.  Last several PSAs have been 0.

## 2023-02-12 NOTE — Assessment & Plan Note (Signed)
Tolerating Lipitor 10 mg daily well.

## 2023-02-12 NOTE — Assessment & Plan Note (Addendum)
At goal today on benazepril HCTZ 10-12.5 once daily. Check labs.

## 2023-02-12 NOTE — Progress Notes (Signed)
Chief Complaint:  Thomas Stuart is a 72 y.o. male who presents today for his annual comprehensive physical exam.    Assessment/Plan:  Chronic Problems Addressed Today: Hypertension At goal today on benazepril HCTZ 10-12.5 once daily. Check labs.  Hyperlipidemia Tolerating Lipitor 10 mg daily well.   Prediabetes Check A1c.  Last A1c 6.4.  We discussed lifestyle modifications including reduction of carbs and sugar.  History of prostate cancer s/p prostatectomy Follows with urology.  Last several PSAs have been 0.  Preventative Healthcare: Check labs.  Due for colonoscopy next year.  Up-to-date on vaccines.  Patient Counseling(The following topics were reviewed and/or handout was given):  -Nutrition: Stressed importance of moderation in sodium/caffeine intake, saturated fat and cholesterol, caloric balance, sufficient intake of fresh fruits, vegetables, and fiber.  -Stressed the importance of regular exercise.   -Substance Abuse: Discussed cessation/primary prevention of tobacco, alcohol, or other drug use; driving or other dangerous activities under the influence; availability of treatment for abuse.   -Injury prevention: Discussed safety belts, safety helmets, smoke detector, smoking near bedding or upholstery.   -Sexuality: Discussed sexually transmitted diseases, partner selection, use of condoms, avoidance of unintended pregnancy and contraceptive alternatives.   -Dental health: Discussed importance of regular tooth brushing, flossing, and dental visits.  -Health maintenance and immunizations reviewed. Please refer to Health maintenance section.  Return to care in 1 year for next preventative visit.     Subjective:  HPI:  He has no acute complaints today. See Assessment / plan for status of chronic conditions.   Lifestyle Diet: Balanced. Plenty of fruits and vegetables.  Exercise: Tries to ride bike     02/12/2023    8:50 AM  Depression screen PHQ 2/9  Decreased  Interest 0  Down, Depressed, Hopeless 0  PHQ - 2 Score 0   There are no preventive care reminders to display for this patient.   ROS: Per HPI, otherwise a complete review of systems was negative.   PMH:  The following were reviewed and entered/updated in epic: Past Medical History:  Diagnosis Date   Arthritis    lower back   BPH (benign prostatic hyperplasia)    Cataract    Erectile dysfunction    Hematuria    Hyperlipidemia    Hypertension    Lumbar disc disease    Nephrolithiasis    Personal history of colonic polyps 2004, 2009, 2017   adenomas   Prostate cancer (HCC) 04/15/2014   Gleason 4+3=7, volume 70 mL   PSA elevation    Urinary retention    Patient Active Problem List   Diagnosis Date Noted   Prediabetes 03/29/2020   Hyperlipidemia 01/21/2017   Lipoma of neck 02/01/2016   Osteoarthritis 01/31/2016   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   Hypertension 01/31/2016   History of prostate cancer s/p prostatectomy 06/14/2014   Benign prostatic hyperplasia with urinary obstruction 04/15/2014   Hx of adenomatous polyp of colon 03/24/2008   Past Surgical History:  Procedure Laterality Date   APPENDECTOMY     COLONOSCOPY  2004, 03/2008, 06/15/2011, 06/2016   2004 - 6 mm polyp, 2009 4 adenomas max 10 mm 2012 - no polyps 2017 diminutive adenoma   CYSTOSCOPY  2010   with fulgeration   CYSTOSCOPY  2006   w/ stone removal   CYSTOSCOPY N/A 04/15/2014   Procedure: CYSTOSCOPY , WITH PROSTATE  BIOPSY;  Surgeon: Anner Crete, MD;  Location: [facility];  Service: Urology;  Laterality: N/A;   INGUINAL  HERNIA REPAIR  2011   left   LITHOTRIPSY  2008   kidney stone   LYMPHADENECTOMY Bilateral 06/14/2014   Procedure: LYMPHADENECTOMY;  Surgeon: Heloise Purpura, MD;  Location: [facility];  Service: Urology;  Laterality: Bilateral;   POLYPECTOMY     PROSTATE BIOPSY  01/2007   benign, TURP   PROSTATE BIOPSY N/A 04/15/2014   Procedure: PROSTATE ULTRASOUND/BIOPSY ;   Surgeon: Anner Crete, MD;  Location: [facility];  Service: Urology;  Laterality: N/A;   PROSTATE SURGERY     ROBOT ASSISTED LAPAROSCOPIC RADICAL PROSTATECTOMY N/A 06/14/2014   Procedure: ROBOTIC ASSISTED LAPAROSCOPIC RADICAL PROSTATECTOMY LEVEL 3;  Surgeon: Heloise Purpura, MD;  Location: [facility];  Service: Urology;  Laterality: N/A;   TONSILLECTOMY     TRANSURETHRAL RESECTION OF PROSTATE  2007    Family History  Problem Relation Age of Onset   Breast cancer Mother 65   Dementia Mother    Transient ischemic attack Mother    Healthy Sister    Healthy Brother    Colon cancer Neg Hx    Esophageal cancer Neg Hx    Rectal cancer Neg Hx    Stomach cancer Neg Hx     Medications- reviewed and updated Current Outpatient Medications  Medication Sig Dispense Refill   atorvastatin (LIPITOR) 10 MG tablet TAKE 1 TABLET BY MOUTH EVERY DAY 90 tablet 1   Coenzyme Q10 (COQ10) 400 MG CAPS Take 1 capsule by mouth daily.     Multiple Vitamin (ONE-A-DAY MENS PO) Take by mouth.     tadalafil (CIALIS) 20 MG tablet Take 20 mg by mouth as directed.     VITAMIN D PO Take by mouth.     vitamin E 1000 UNIT capsule Take 1,000 Units by mouth daily.     benazepril-hydrochlorthiazide (LOTENSIN HCT) 10-12.5 MG tablet Take 1 tablet by mouth daily. 90 tablet 3   No current facility-administered medications for this visit.    Allergies-reviewed and updated No Known Allergies  Social History   Socioeconomic History   Marital status: Single    Spouse name: Not on file   Number of children: Not on file   Years of education: Not on file   Highest education level: Not on file  Occupational History   Occupation: reired  Tobacco Use   Smoking status: Never   Smokeless tobacco: Never  Vaping Use   Vaping Use: Never used  Substance and Sexual Activity   Alcohol use: No   Drug use: No   Sexual activity: Yes  Other Topics Concern   Not on file  Social History Narrative   Not on file    Social Determinants of Health   Financial Resource Strain: Low Risk  (04/16/2022)   Overall Financial Resource Strain (CARDIA)    Difficulty of Paying Living Expenses: Not hard at all  Food Insecurity: No Food Insecurity (04/16/2022)   Hunger Vital Sign    Worried About Running Out of Food in the Last Year: Never true    Ran Out of Food in the Last Year: Never true  Transportation Needs: No Transportation Needs (04/06/2021)   PRAPARE - Administrator, Civil Service (Medical): No    Lack of Transportation (Non-Medical): No  Physical Activity: Insufficiently Active (04/16/2022)   Exercise Vital Sign    Days of Exercise per Week: 3 days    Minutes of Exercise per Session: 30 min  Stress: No Stress Concern Present (04/16/2022)   [employer] of Occupational  Health - Occupational Stress Questionnaire    Feeling of Stress : Not at all  Social Connections: Unknown (04/16/2022)   Social Connection and Isolation Panel [NHANES]    Frequency of Communication with Friends and Family: More than three times a week    Frequency of Social Gatherings with Friends and Family: More than three times a week    Attends Religious Services: Not on Marketing executive or Organizations: Not on file    Attends Banker Meetings: Never    Marital Status: Never married        Objective:  Physical Exam: BP 117/83   Pulse 89   Temp 97.8 F (36.6 C) (Temporal)   Ht 5\' 11"  (1.803 m)   Wt 237 lb (107.5 kg)   SpO2 99%   BMI 33.05 kg/m   Body mass index is 33.05 kg/m. Wt Readings from Last 3 Encounters:  02/12/23 237 lb (107.5 kg)  04/16/22 225 lb (102.1 kg)  02/08/22 231 lb 9.6 oz (105.1 kg)   Gen: NAD, resting comfortably HEENT: TMs normal bilaterally. OP clear. No thyromegaly noted.  CV: RRR with no murmurs appreciated Pulm: NWOB, CTAB with no crackles, wheezes, or rhonchi GI: Normal bowel sounds present. Soft, Nontender, Nondistended. MSK: no edema, cyanosis,  or clubbing noted Skin: warm, dry Neuro: CN2-12 grossly intact. Strength 5/5 in upper and lower extremities. Reflexes symmetric and intact bilaterally.  Psych: Normal affect and thought content     Tajae Maiolo M. Jimmey Ralph, MD 02/12/2023 9:23 AM

## 2023-02-12 NOTE — Assessment & Plan Note (Signed)
Check A1c.  Last A1c 6.4.  We discussed lifestyle modifications including reduction of carbs and sugar.

## 2023-02-13 NOTE — Progress Notes (Signed)
Cholesterol up a little bit since last year.  Recommend increasing Lipitor to 20 mg daily.  Please send in new prescription for this.  We should recheck labs again in 3 to 6 months.  His blood sugar is at the borderline diabetic range.  He can schedule an office visit to discuss medication options to help with treatment for this if he wishes.  If he wishes to continue with lifestyle changes including diet and exercise that is okay as well.  We should have him come back in 3 months for an in person office visit to recheck his A1c.  The rest of his labs are all stable and we can recheck in a year.

## 2023-03-21 ENCOUNTER — Encounter (INDEPENDENT_AMBULATORY_CARE_PROVIDER_SITE_OTHER): Payer: Self-pay

## 2023-03-30 DIAGNOSIS — E785 Hyperlipidemia, unspecified: Secondary | ICD-10-CM | POA: Diagnosis not present

## 2023-03-30 DIAGNOSIS — I10 Essential (primary) hypertension: Secondary | ICD-10-CM | POA: Diagnosis not present

## 2023-03-30 DIAGNOSIS — E538 Deficiency of other specified B group vitamins: Secondary | ICD-10-CM | POA: Diagnosis not present

## 2023-03-30 DIAGNOSIS — E669 Obesity, unspecified: Secondary | ICD-10-CM | POA: Diagnosis not present

## 2023-03-30 DIAGNOSIS — E1169 Type 2 diabetes mellitus with other specified complication: Secondary | ICD-10-CM | POA: Diagnosis not present

## 2023-05-08 ENCOUNTER — Telehealth: Payer: Self-pay | Admitting: Family Medicine

## 2023-05-08 NOTE — Telephone Encounter (Signed)
Patient states he was returning call in regards to Medicare AWV. Patient states he already had this since insurance nurse came to his home for this.

## 2023-05-10 ENCOUNTER — Other Ambulatory Visit: Payer: Self-pay | Admitting: Family Medicine

## 2023-05-10 DIAGNOSIS — E785 Hyperlipidemia, unspecified: Secondary | ICD-10-CM

## 2023-05-20 ENCOUNTER — Encounter: Payer: Self-pay | Admitting: Family Medicine

## 2023-05-20 ENCOUNTER — Ambulatory Visit: Payer: PPO | Admitting: Family Medicine

## 2023-05-20 VITALS — BP 114/71 | HR 75 | Temp 97.5°F | Ht 71.0 in | Wt 222.0 lb

## 2023-05-20 DIAGNOSIS — I10 Essential (primary) hypertension: Secondary | ICD-10-CM

## 2023-05-20 DIAGNOSIS — R7303 Prediabetes: Secondary | ICD-10-CM

## 2023-05-20 DIAGNOSIS — Z8546 Personal history of malignant neoplasm of prostate: Secondary | ICD-10-CM | POA: Diagnosis not present

## 2023-05-20 LAB — POCT GLYCOSYLATED HEMOGLOBIN (HGB A1C): Hemoglobin A1C: 6.1 % — AB (ref 4.0–5.6)

## 2023-05-20 MED ORDER — ATORVASTATIN CALCIUM 20 MG PO TABS: 20.0000 mg | ORAL_TABLET | Freq: Every day | ORAL | 3 refills | Status: DC

## 2023-05-20 NOTE — Patient Instructions (Signed)
It was very nice to see you today!  You are doing a great job!  Keep up the great work.  No medication changes today.  Return in about 9 months (around 02/17/2024) for Annual Physical.   Take care, Dr Jimmey Ralph  PLEASE NOTE:  If you had any lab tests, please let us know if you have not heard back within a few days. You may see your results on mychart before we have a chance to review them but we will give you a call once they are reviewed by [place].   If we ordered any referrals today, please let us know if you have not heard from their office within the next week.   If you had any urgent prescriptions sent in today, please check with the pharmacy within an hour of our visit to make sure the prescription was transmitted appropriately.   Please try these tips to maintain a healthy lifestyle:  Eat at least 3 REAL meals and 1-2 snacks per day.  Aim for no more than 5 hours between eating.  If you eat breakfast, please do so within one hour of getting up.   Each meal should contain half fruits/vegetables, one quarter protein, and one quarter carbs (no bigger than a computer mouse)  Cut down on sweet beverages. This includes juice, soda, and sweet tea.   Drink at least 1 glass of water with each meal and aim for at least 8 glasses per day  Exercise at least 150 minutes every week.

## 2023-05-20 NOTE — Progress Notes (Signed)
   Thomas Stuart is a 72 y.o. male who presents today for an office visit.  Assessment/Plan:  Chronic Problems Addressed Today: Hypertension Blood pressure at goal on benazepril HCTZ 10-12.5 once daily.  Prediabetes A1c improved to 6.1.  He is down about 15 pounds since our last visit.  He is doing a great job with diet and exercise.  Congratulated patient on weight loss.  We briefly did discuss medications as he does have a few family members for all medications as well.  Given his great reduction in weight and A1c with lifestyle interventions alone do not think we need to start any medications at this point.  He will continue working on diet and exercise and we can recheck in 6 to 9 months.  History of prostate cancer s/p prostatectomy Having a little bit more urinary frequency recently.  He will follow-up with urology soon to discuss.  All of his recent PSAs have been 0.  Last saw urology a few months ago.     Subjective:  HPI:  See A/P for status of chronic conditions.  Patient is here today for follow-up.  Saw him 3 months ago.  At that time A1c was 6.5. Since our last visit he has been working on diet and exercise. Cutting down on bread and carbohydrates.        Objective:  Physical Exam: BP 114/71   Pulse 75   Temp (!) 97.5 F (36.4 C) (Temporal)   Ht 5\' 11"  (1.803 m)   Wt 222 lb (100.7 kg)   SpO2 100%   BMI 30.96 kg/m   Wt Readings from Last 3 Encounters:  05/20/23 222 lb (100.7 kg)  02/12/23 237 lb (107.5 kg)  04/16/22 225 lb (102.1 kg)    Gen: No acute distress, resting comfortably CV: Regular rate and rhythm with no murmurs appreciated Pulm: Normal work of breathing, clear to auscultation bilaterally with no crackles, wheezes, or rhonchi Neuro: Grossly normal, moves all extremities Psych: Normal affect and thought content      Sagal Gayton M. Jimmey Ralph, MD 05/20/2023 9:43 AM

## 2023-05-20 NOTE — Assessment & Plan Note (Signed)
Having a little bit more urinary frequency recently.  He will follow-up with urology soon to discuss.  All of his recent PSAs have been 0.  Last saw urology a few months ago.

## 2023-05-20 NOTE — Assessment & Plan Note (Signed)
A1c improved to 6.1.  He is down about 15 pounds since our last visit.  He is doing a great job with diet and exercise.  Congratulated patient on weight loss.  We briefly did discuss medications as he does have a few family members for all medications as well.  Given his great reduction in weight and A1c with lifestyle interventions alone do not think we need to start any medications at this point.  He will continue working on diet and exercise and we can recheck in 6 to 9 months.

## 2023-05-20 NOTE — Assessment & Plan Note (Signed)
Blood pressure at goal on benazepril HCTZ 10-12.5 once daily.

## 2023-09-03 ENCOUNTER — Other Ambulatory Visit: Payer: Self-pay | Admitting: Family Medicine

## 2023-09-03 NOTE — Telephone Encounter (Signed)
Copied from CRM [id number]. Topic: Clinical - Medication Refill >> Sep 03, 2023 11:08 AM Corin V wrote: Most Recent Primary Care Visit:  Provider: Ardith Dark  Department: [facility]  Visit Type: OFFICE VISIT  Date: 05/20/2023  Medication: atorvastatin (LIPITOR) 20 MG tablet  Has the patient contacted their pharmacy? Yes- no refills remaining (Agent: If no, request that the patient contact the pharmacy for the refill. If patient does not wish to contact the pharmacy document the reason why and proceed with request.) (Agent: If yes, when and what did the pharmacy advise?)  Is this the correct pharmacy for this prescription? Yes If no, delete pharmacy and type the correct one.  This is the patient's preferred pharmacy:  [id number] - [place], [place] - [address] [place] [address] Phone: [phone] Fax: [phone]   Has the prescription been filled recently? No  Is the patient out of the medication? Yes  Has the patient been seen for an appointment in the last year OR does the patient have an upcoming appointment? Yes  Can we respond through MyChart? No  Agent: Please be advised that Rx refills may take up to 3 business days. We ask that you follow-up with your pharmacy.

## 2023-10-16 DIAGNOSIS — H2513 Age-related nuclear cataract, bilateral: Secondary | ICD-10-CM | POA: Diagnosis not present

## 2023-10-16 DIAGNOSIS — E119 Type 2 diabetes mellitus without complications: Secondary | ICD-10-CM | POA: Diagnosis not present

## 2023-10-16 DIAGNOSIS — H35361 Drusen (degenerative) of macula, right eye: Secondary | ICD-10-CM | POA: Diagnosis not present

## 2023-10-16 DIAGNOSIS — H35371 Puckering of macula, right eye: Secondary | ICD-10-CM | POA: Diagnosis not present

## 2023-10-16 LAB — HM DIABETES EYE EXAM

## 2023-10-17 ENCOUNTER — Encounter: Payer: Self-pay | Admitting: Family Medicine

## 2023-11-06 DIAGNOSIS — C61 Malignant neoplasm of prostate: Secondary | ICD-10-CM | POA: Diagnosis not present

## 2023-11-06 LAB — PSA: PSA: 0.031

## 2023-11-13 DIAGNOSIS — Z8546 Personal history of malignant neoplasm of prostate: Secondary | ICD-10-CM | POA: Diagnosis not present

## 2023-11-13 DIAGNOSIS — N5201 Erectile dysfunction due to arterial insufficiency: Secondary | ICD-10-CM | POA: Diagnosis not present

## 2023-11-15 ENCOUNTER — Encounter: Payer: Self-pay | Admitting: Family Medicine

## 2024-02-13 ENCOUNTER — Ambulatory Visit: Payer: PPO | Admitting: Family Medicine

## 2024-02-13 ENCOUNTER — Other Ambulatory Visit (HOSPITAL_COMMUNITY): Payer: Self-pay

## 2024-02-13 ENCOUNTER — Telehealth: Payer: Self-pay

## 2024-02-13 VITALS — BP 136/84 | HR 89 | Temp 99.1°F | Ht 71.0 in | Wt 228.0 lb

## 2024-02-13 DIAGNOSIS — E785 Hyperlipidemia, unspecified: Secondary | ICD-10-CM

## 2024-02-13 DIAGNOSIS — E1165 Type 2 diabetes mellitus with hyperglycemia: Secondary | ICD-10-CM

## 2024-02-13 DIAGNOSIS — Z8546 Personal history of malignant neoplasm of prostate: Secondary | ICD-10-CM

## 2024-02-13 DIAGNOSIS — L739 Follicular disorder, unspecified: Secondary | ICD-10-CM | POA: Insufficient documentation

## 2024-02-13 DIAGNOSIS — I10 Essential (primary) hypertension: Secondary | ICD-10-CM

## 2024-02-13 DIAGNOSIS — Z7985 Long-term (current) use of injectable non-insulin antidiabetic drugs: Secondary | ICD-10-CM

## 2024-02-13 DIAGNOSIS — Z0001 Encounter for general adult medical examination with abnormal findings: Secondary | ICD-10-CM | POA: Diagnosis not present

## 2024-02-13 LAB — CBC
HCT: 38.9 % — ABNORMAL LOW (ref 39.0–52.0)
Hemoglobin: 12.1 g/dL — ABNORMAL LOW (ref 13.0–17.0)
MCHC: 31.2 g/dL (ref 30.0–36.0)
MCV: 74.4 fl — ABNORMAL LOW (ref 78.0–100.0)
Platelets: 316 K/uL (ref 150.0–400.0)
RBC: 5.23 Mil/uL (ref 4.22–5.81)
RDW: 19.3 % — ABNORMAL HIGH (ref 11.5–15.5)
WBC: 8.8 K/uL (ref 4.0–10.5)

## 2024-02-13 LAB — COMPREHENSIVE METABOLIC PANEL WITH GFR
ALT: 22 U/L (ref 0–53)
AST: 23 U/L (ref 0–37)
Albumin: 4.4 g/dL (ref 3.5–5.2)
Alkaline Phosphatase: 63 U/L (ref 39–117)
BUN: 14 mg/dL (ref 6–23)
CO2: 28 meq/L (ref 19–32)
Calcium: 9.2 mg/dL (ref 8.4–10.5)
Chloride: 102 meq/L (ref 96–112)
Creatinine, Ser: 0.88 mg/dL (ref 0.40–1.50)
GFR: 86.26 mL/min (ref >60.00)
Glucose, Bld: 129 mg/dL — ABNORMAL HIGH (ref 70–99)
Potassium: 3.8 meq/L (ref 3.5–5.1)
Sodium: 137 meq/L (ref 135–145)
Total Bilirubin: 0.6 mg/dL (ref 0.2–1.2)
Total Protein: 7 g/dL (ref 6.0–8.3)

## 2024-02-13 LAB — LIPID PANEL
Cholesterol: 131 mg/dL (ref 0–200)
HDL: 46.7 mg/dL (ref >39.00)
LDL Cholesterol: 71 mg/dL (ref 0–99)
NonHDL: 84.12
Total CHOL/HDL Ratio: 3
Triglycerides: 68 mg/dL (ref 0.0–149.0)
VLDL: 13.6 mg/dL (ref 0.0–40.0)

## 2024-02-13 LAB — HEMOGLOBIN A1C: Hgb A1c MFr Bld: 7 % — ABNORMAL HIGH (ref 4.6–6.5)

## 2024-02-13 LAB — TSH: TSH: 0.82 u[IU]/mL (ref 0.35–5.50)

## 2024-02-13 MED ORDER — TIRZEPATIDE 2.5 MG/0.5ML ~~LOC~~ SOAJ
2.5000 mg | SUBCUTANEOUS | 0 refills | Status: DC
Start: 2024-02-13 — End: 2024-03-09

## 2024-02-13 NOTE — Patient Instructions (Signed)
 It was very nice to see you today!  Monitor your blood pressure and let us  know if it is persistently elevated.  Please try the Mounjaro .  We will start 2.5 mg weekly.  Let me know in a few weeks how this is working for you.  Will check blood work today.  Will see you back in 3 to 6 months to recheck your sugar.  Come back sooner if needed.  Return in about 6 months (around 08/15/2024) for Follow Up.   Take care, Dr Kennyth  PLEASE NOTE:  If you had any lab tests, please let us  know if you have not heard back within a few days. You may see your results on mychart before we have a chance to review them but we will give you a call once they are reviewed by us .   If we ordered any referrals today, please let us  know if you have not heard from their office within the next week.   If you had any urgent prescriptions sent in today, please check with the pharmacy within an hour of our visit to make sure the prescription was transmitted appropriately.   Please try these tips to maintain a healthy lifestyle:  Eat at least 3 REAL meals and 1-2 snacks per day.  Aim for no more than 5 hours between eating.  If you eat breakfast, please do so within one hour of getting up.   Each meal should contain half fruits/vegetables, one quarter protein, and one quarter carbs (no bigger than a computer mouse)  Cut down on sweet beverages. This includes juice, soda, and sweet tea.   Drink at least 1 glass of water  with each meal and aim for at least 8 glasses per day  Exercise at least 150 minutes every week.     Preventive Care 28 Years and Older, Male Preventive care refers to lifestyle choices and visits with your health care provider that can promote health and wellness. Preventive care visits are also called wellness exams. What can I expect for my preventive care visit? Counseling During your preventive care visit, your health care provider may ask about your: Medical history, including: Past  medical problems. Family medical history. History of falls. Current health, including: Emotional well-being. Home life and relationship well-being. Sexual activity. Memory and ability to understand (cognition). Lifestyle, including: Alcohol, nicotine or tobacco, and drug use. Access to firearms. Diet, exercise, and sleep habits. Work and work Astronomer. Sunscreen use. Safety issues such as seatbelt and bike helmet use. Physical exam Your health care provider will check your: Height and weight. These may be used to calculate your BMI (body mass index). BMI is a measurement that tells if you are at a healthy weight. Waist circumference. This measures the distance around your waistline. This measurement also tells if you are at a healthy weight and may help predict your risk of certain diseases, such as type 2 diabetes and high blood pressure. Heart rate and blood pressure. Body temperature. Skin for abnormal spots. What immunizations do I need?  Vaccines are usually given at various ages, according to a schedule. Your health care provider will recommend vaccines for you based on your age, medical history, and lifestyle or other factors, such as travel or where you work. What tests do I need? Screening Your health care provider may recommend screening tests for certain conditions. This may include: Lipid and cholesterol levels. Diabetes screening. This is done by checking your blood sugar (glucose) after you have not eaten  for a while (fasting). Hepatitis C test. Hepatitis B test. HIV (human immunodeficiency virus) test. STI (sexually transmitted infection) testing, if you are at risk. Lung cancer screening. Colorectal cancer screening. Prostate cancer screening. Abdominal aortic aneurysm (AAA) screening. You may need this if you are a current or former smoker. Talk with your health care provider about your test results, treatment options, and if necessary, the need for more  tests. Follow these instructions at home: Eating and drinking  Eat a diet that includes fresh fruits and vegetables, whole grains, lean protein, and low-fat dairy products. Limit your intake of foods with high amounts of sugar, saturated fats, and salt. Take vitamin and mineral supplements as recommended by your health care provider. Do not drink alcohol if your health care provider tells you not to drink. If you drink alcohol: Limit how much you have to 0-2 drinks a day. Know how much alcohol is in your drink. In the [place] one drink equals one 12 oz bottle of beer (355 mL), one 5 oz glass of wine (148 mL), or one 1 oz glass of hard liquor (44 mL). Lifestyle Brush your teeth every morning and night with fluoride toothpaste. Floss one time each day. Exercise for at least 30 minutes 5 or more days each week. Do not use any products that contain nicotine or tobacco. These products include cigarettes, chewing tobacco, and vaping devices, such as e-cigarettes. If you need help quitting, ask your health care provider. Do not use drugs. If you are sexually active, practice safe sex. Use a condom or other form of protection to prevent STIs. Take aspirin only as told by your health care provider. Make sure that you understand how much to take and what form to take. Work with your health care provider to find out whether it is safe and beneficial for you to take aspirin daily. Ask your health care provider if you need to take a cholesterol-lowering medicine (statin). Find healthy ways to manage stress, such as: Meditation, yoga, or listening to music. Journaling. Talking to a trusted person. Spending time with friends and family. Safety Always wear your seat belt while driving or riding in a vehicle. Do not drive: If you have been drinking alcohol. Do not ride with someone who has been drinking. When you are tired or distracted. While texting. If you have been using any mind-altering substances  or drugs. Wear a helmet and other protective equipment during sports activities. If you have firearms in your house, make sure you follow all gun safety procedures. Minimize exposure to UV radiation to reduce your risk of skin cancer. What's next? Visit your health care provider once a year for an annual wellness visit. Ask your health care provider how often you should have your eyes and teeth checked. Stay up to date on all vaccines. This information is not intended to replace advice given to you by your health care provider. Make sure you discuss any questions you have with your health care provider. Document Revised: 01/18/2021 Document Reviewed: 01/18/2021 Elsevier Patient Education  2024 [employer].

## 2024-02-13 NOTE — Assessment & Plan Note (Signed)
 Initially elevated however at goal on recheck on benazepril  HCTZ 10-12.5 once daily.

## 2024-02-13 NOTE — Progress Notes (Signed)
 Chief Complaint:  Thomas Stuart is a 73 y.o. male who presents today for his annual comprehensive physical exam.    Assessment/Plan:  Chronic Problems Addressed Today: Type 2 diabetes mellitus with hyperglycemia (HCC) His most recent A1c was 6.1 though was 6.5 last year.  Will recheck A1c today.  He has been working on last time interventions however he is up about 6 pounds since our last visit.  We did discuss medication options to help with managing his sugars and also help with weight loss.  He is agreeable to try GLP-1 agonist.  We did discuss potential side effects.  Will start Mounjaro  2.5 mg weekly and he will follow-up with us  in a few weeks via MyChart we can adjust the dose as tolerated.  Recheck A1c in 3 to 6 months.  Hypertension Initially elevated however at goal on recheck on benazepril  HCTZ 10-12.5 once daily.  History of prostate cancer s/p prostatectomy Continue management per neurology.  Recent PSA remains undetectable.  Hyperlipidemia Check lipids.  Preventative Healthcare: Check labs.  Due for colonoscopy later this year.  Up-to-date on vaccines.  Patient Counseling(The following topics were reviewed and/or handout was given):  -Nutrition: Stressed importance of moderation in sodium/caffeine intake, saturated fat and cholesterol, caloric balance, sufficient intake of fresh fruits, vegetables, and fiber.  -Stressed the importance of regular exercise.   -Substance Abuse: Discussed cessation/primary prevention of tobacco, alcohol, or other drug use; driving or other dangerous activities under the influence; availability of treatment for abuse.   -Injury prevention: Discussed safety belts, safety helmets, smoke detector, smoking near bedding or upholstery.   -Sexuality: Discussed sexually transmitted diseases, partner selection, use of condoms, avoidance of unintended pregnancy and contraceptive alternatives.   -Dental health: Discussed importance of regular tooth  brushing, flossing, and dental visits.  -Health maintenance and immunizations reviewed. Please refer to Health maintenance section.  Return to care in 1 year for next preventative visit.     Subjective:  HPI:  He has no acute complaints today. See Assessment / plan for status of chronic conditions. He is doing well today.    Lifestyle Diet: Trying to cut down on sweets and carbohydrates.  Exercise: Trying to stay more active.      02/13/2024    8:45 AM  Depression screen PHQ 2/9  Decreased Interest 0  Down, Depressed, Hopeless 0  PHQ - 2 Score 0  Altered sleeping 0  Tired, decreased energy 0  Change in appetite 0  Feeling bad or failure about yourself  0  Trouble concentrating 0  Moving slowly or fidgety/restless 0  Suicidal thoughts 0  PHQ-9 Score 0  Difficult doing work/chores Not difficult at all    Health Maintenance Due  Topic Date Due   Diabetic kidney evaluation - Urine ACR  Never done   FOOT EXAM  01/23/2019   HEMOGLOBIN A1C  11/18/2023   Diabetic kidney evaluation - eGFR measurement  02/12/2024     ROS: Per HPI, otherwise a complete review of systems was negative.   PMH:  The following were reviewed and entered/updated in epic: Past Medical History:  Diagnosis Date   Arthritis    lower back   BPH (benign prostatic hyperplasia)    Cataract    Erectile dysfunction    Hematuria    Hyperlipidemia    Hypertension    Lumbar disc disease    Nephrolithiasis    Personal history of colonic polyps 2004, 2009, 2017   adenomas   Prostate cancer (HCC)  04/15/2014   Gleason 4+3=7, volume 70 mL   PSA elevation    Urinary retention    Patient Active Problem List   Diagnosis Date Noted   Folliculitis 02/13/2024   Type 2 diabetes mellitus with hyperglycemia (HCC) 03/29/2020   Hyperlipidemia 01/21/2017   Non morbid obesity due to excess calories 02/12/2016   Lipoma of neck 02/01/2016   History of radical prostatectomy 02/01/2016   Osteoarthritis 01/31/2016    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   Hypertension 01/31/2016   Plantar fasciitis 01/31/2016   History of prostate cancer s/p prostatectomy 06/14/2014   Malignant neoplasm of prostate (HCC) 06/01/2014   Benign prostatic hyperplasia with urinary obstruction 04/15/2014   Hx of adenomatous polyp of colon 03/24/2008   Past Surgical History:  Procedure Laterality Date   APPENDECTOMY     COLONOSCOPY  2004, 03/2008, 06/15/2011, 06/2016   2004 - 6 mm polyp, 2009 4 adenomas max 10 mm 2012 - no polyps 2017 diminutive adenoma   CYSTOSCOPY  2010   with fulgeration   CYSTOSCOPY  2006   w/ stone removal   CYSTOSCOPY N/A 04/15/2014   Procedure: CYSTOSCOPY , WITH PROSTATE  BIOPSY;  Surgeon: Norleen JINNY Seltzer, MD;  Location: [facility];  Service: Urology;  Laterality: N/A;   INGUINAL HERNIA REPAIR  2011   left   LITHOTRIPSY  2008   kidney stone   LYMPHADENECTOMY Bilateral 06/14/2014   Procedure: LYMPHADENECTOMY;  Surgeon: Gretel Ferrara, MD;  Location: [facility];  Service: Urology;  Laterality: Bilateral;   POLYPECTOMY     PROSTATE BIOPSY  01/2007   benign, TURP   PROSTATE BIOPSY N/A 04/15/2014   Procedure: PROSTATE ULTRASOUND/BIOPSY ;  Surgeon: Norleen JINNY Seltzer, MD;  Location: [facility];  Service: Urology;  Laterality: N/A;   PROSTATE SURGERY     ROBOT ASSISTED LAPAROSCOPIC RADICAL PROSTATECTOMY N/A 06/14/2014   Procedure: ROBOTIC ASSISTED LAPAROSCOPIC RADICAL PROSTATECTOMY LEVEL 3;  Surgeon: Gretel Ferrara, MD;  Location: [facility];  Service: Urology;  Laterality: N/A;   TONSILLECTOMY     TRANSURETHRAL RESECTION OF PROSTATE  2007    Family History  Problem Relation Age of Onset   Breast cancer Mother 22   Dementia Mother    Transient ischemic attack Mother    Healthy Sister    Healthy Brother    Colon cancer Neg Hx    Esophageal cancer Neg Hx    Rectal cancer Neg Hx    Stomach cancer Neg Hx     Medications- reviewed and updated Current Outpatient Medications   Medication Sig Dispense Refill   atorvastatin  (LIPITOR) 20 MG tablet Take 1 tablet (20 mg total) by mouth daily. 90 tablet 3   benazepril -hydrochlorthiazide (LOTENSIN  HCT) 10-12.5 MG tablet Take 1 tablet by mouth daily. 90 tablet 3   Coenzyme Q10 (COQ10) 400 MG CAPS Take 1 capsule by mouth daily.     Multiple Vitamin (ONE-A-DAY MENS PO) Take by mouth.     tirzepatide  (MOUNJARO ) 2.5 MG/0.5ML Pen Inject 2.5 mg into the skin once a week. 2 mL 0   VITAMIN D PO Take by mouth.     vitamin E 1000 UNIT capsule Take 1,000 Units by mouth daily.     tadalafil (CIALIS) 20 MG tablet Take 20 mg by mouth as directed. (Patient not taking: Reported on 02/13/2024)     No current facility-administered medications for this visit.    Allergies-reviewed and updated No Known Allergies  Social History   Socioeconomic History   Marital  status: Single    Spouse name: Not on file   Number of children: Not on file   Years of education: Not on file   Highest education level: Not on file  Occupational History   Occupation: reired  Tobacco Use   Smoking status: Never   Smokeless tobacco: Never  Vaping Use   Vaping status: Never Used  Substance and Sexual Activity   Alcohol use: No   Drug use: No   Sexual activity: Yes  Other Topics Concern   Not on file  Social History Narrative   Not on file   Social Drivers of Health   Financial Resource Strain: Low Risk  (04/16/2022)   Overall Financial Resource Strain (CARDIA)    Difficulty of Paying Living Expenses: Not hard at all  Food Insecurity: No Food Insecurity (04/16/2022)   Hunger Vital Sign    Worried About Running Out of Food in the Last Year: Never true    Ran Out of Food in the Last Year: Never true  Transportation Needs: No Transportation Needs (04/06/2021)   PRAPARE - Administrator, Civil Service (Medical): No    Lack of Transportation (Non-Medical): No  Physical Activity: Insufficiently Active (04/16/2022)   Exercise Vital Sign     Days of Exercise per Week: 3 days    Minutes of Exercise per Session: 30 min  Stress: No Stress Concern Present (04/16/2022)   [employer] of Occupational Health - Occupational Stress Questionnaire    Feeling of Stress : Not at all  Social Connections: Unknown (04/16/2022)   Social Connection and Isolation Panel    Frequency of Communication with Friends and Family: More than three times a week    Frequency of Social Gatherings with Friends and Family: More than three times a week    Attends Religious Services: Not on Marketing executive or Organizations: Not on file    Attends Banker Meetings: Never    Marital Status: Never married        Objective:  Physical Exam: BP 136/84 (BP Location: Right Arm, Patient Position: Sitting, Cuff Size: Normal)   Pulse 89   Temp 99.1 F (37.3 C) (Temporal)   Ht 5' 11 (1.803 m)   Wt 228 lb (103.4 kg)   SpO2 99%   BMI 31.80 kg/m   Body mass index is 31.8 kg/m. Wt Readings from Last 3 Encounters:  02/13/24 228 lb (103.4 kg)  05/20/23 222 lb (100.7 kg)  02/12/23 237 lb (107.5 kg)   Gen: NAD, resting comfortably HEENT: TMs normal bilaterally. OP clear. No thyromegaly noted.  CV: RRR with no murmurs appreciated Pulm: NWOB, CTAB with no crackles, wheezes, or rhonchi GI: Normal bowel sounds present. Soft, Nontender, Nondistended. MSK: no edema, cyanosis, or clubbing noted Skin: warm, dry Neuro: CN2-12 grossly intact. Strength 5/5 in upper and lower extremities. Reflexes symmetric and intact bilaterally.  Psych: Normal affect and thought content     Felina Tello M. Kennyth, MD 02/13/2024 9:20 AM

## 2024-02-13 NOTE — Telephone Encounter (Signed)
 Pharmacy Patient Advocate Encounter  Received notification from [facility] [employer]ANT[employer] ADV[employer] that Prior Authorization for Mounjaro  2.5MG /0.5ML auto-injectors has been APPROVED from 02/13/2024 to 02/12/2025. Ran test claim, Copay is $47. This test claim was processed through [facility]- copay amounts may vary at other pharmacies due to pharmacy/plan contracts, or as the patient moves through the different stages of their insurance plan.   PA #/Case ID/Reference #: [id number]

## 2024-02-13 NOTE — Assessment & Plan Note (Signed)
 Continue management per neurology.  Recent PSA remains undetectable.

## 2024-02-13 NOTE — Telephone Encounter (Signed)
 Pharmacy Patient Advocate Encounter   Received notification from CoverMyMeds that prior authorization for  Mounjaro  2.5MG /0.5ML auto-injectors is required/requested.   Insurance verification completed.   The patient is insured through [facility] ADVANTAGE/RX ADVANCE .   Per test claim: PA required; PA submitted to above mentioned insurance via CoverMyMeds Key/confirmation #/EOC [id number] Status is pending

## 2024-02-13 NOTE — Assessment & Plan Note (Signed)
 His most recent A1c was 6.1 though was 6.5 last year.  Will recheck A1c today.  He has been working on last time interventions however he is up about 6 pounds since our last visit.  We did discuss medication options to help with managing his sugars and also help with weight loss.  He is agreeable to try GLP-1 agonist.  We did discuss potential side effects.  Will start Mounjaro  2.5 mg weekly and he will follow-up with us  in a few weeks via MyChart we can adjust the dose as tolerated.  Recheck A1c in 3 to 6 months.

## 2024-02-13 NOTE — Assessment & Plan Note (Signed)
 Check lipids

## 2024-02-14 ENCOUNTER — Ambulatory Visit: Payer: Self-pay | Admitting: Family Medicine

## 2024-02-14 DIAGNOSIS — R7989 Other specified abnormal findings of blood chemistry: Secondary | ICD-10-CM

## 2024-02-14 NOTE — Progress Notes (Signed)
 His blood counts dropped quite a bit since last year.  Can we have him come back to recheck CBC with differential, iron panel, and B12.  His A1c is elevated into the diabetic range.  This is up quite a bit since last time that we checked.  I hope this will come down with the Mounjaro  that we discussed at his office visit.  I would like to see him back in 3 months to recheck his A1c.  All of his other labs are at goal and we can recheck in a year.

## 2024-02-14 NOTE — Telephone Encounter (Signed)
 Patient notified Mounjaro  2.5MG /0.5ML auto-injectors has been APPROVED from 02/13/2024 to 02/12/2025 Verbalized understanding

## 2024-02-18 ENCOUNTER — Other Ambulatory Visit: Payer: Self-pay | Admitting: *Deleted

## 2024-02-18 ENCOUNTER — Other Ambulatory Visit (INDEPENDENT_AMBULATORY_CARE_PROVIDER_SITE_OTHER)

## 2024-02-18 DIAGNOSIS — R7989 Other specified abnormal findings of blood chemistry: Secondary | ICD-10-CM | POA: Diagnosis not present

## 2024-02-18 LAB — CBC WITH DIFFERENTIAL/PLATELET
Basophils Absolute: 0.1 K/uL (ref 0.0–0.1)
Basophils Relative: 0.6 % (ref 0.0–3.0)
Eosinophils Absolute: 0.2 K/uL (ref 0.0–0.7)
Eosinophils Relative: 2.5 % (ref 0.0–5.0)
HCT: 38.5 % — ABNORMAL LOW (ref 39.0–52.0)
Hemoglobin: 12.2 g/dL — ABNORMAL LOW (ref 13.0–17.0)
Lymphocytes Relative: 32.8 % (ref 12.0–46.0)
Lymphs Abs: 3 K/uL (ref 0.7–4.0)
MCHC: 31.8 g/dL (ref 30.0–36.0)
MCV: 73.3 fl — ABNORMAL LOW (ref 78.0–100.0)
Monocytes Absolute: 0.8 K/uL (ref 0.1–1.0)
Monocytes Relative: 8.7 % (ref 3.0–12.0)
Neutro Abs: 5 K/uL (ref 1.4–7.7)
Neutrophils Relative %: 55.4 % (ref 43.0–77.0)
Platelets: 316 K/uL (ref 150.0–400.0)
RBC: 5.25 Mil/uL (ref 4.22–5.81)
RDW: 19.2 % — ABNORMAL HIGH (ref 11.5–15.5)
WBC: 9 K/uL (ref 4.0–10.5)

## 2024-02-18 LAB — IRON,TIBC AND FERRITIN PANEL
%SAT: 7 % — ABNORMAL LOW (ref 20–48)
Ferritin: 4 ng/mL — ABNORMAL LOW (ref 24–380)
Iron: 30 ug/dL — ABNORMAL LOW (ref 50–180)
TIBC: 441 ug/dL — ABNORMAL HIGH (ref 250–425)

## 2024-02-18 LAB — VITAMIN B12: Vitamin B-12: 762 pg/mL (ref 211–911)

## 2024-02-20 ENCOUNTER — Ambulatory Visit: Payer: Self-pay | Admitting: Family Medicine

## 2024-02-20 NOTE — Progress Notes (Signed)
 His blood work confirms that he is low in iron.  Recommend that he start ferrous sulfate 65 mg every other day on an empty stomach to help however we need to make sure that he is not having any internal bleeding.  Recommend referral to gastroenterology for further evaluation.

## 2024-02-20 NOTE — Telephone Encounter (Signed)
 Copied from CRM [id number]. Topic: Clinical - Lab/Test Results >> Feb 19, 2024  1:16 PM Mercedes MATSU wrote: Reason for CRM: Patient called in requesting a call back from the nurse because he would like to go over his results in more depth. Patient states he does not understand his results and needs abetter explanation of them. Patient can be reached at the number on file [phone].  See results note Suhaib Guzzo,RMA

## 2024-02-21 ENCOUNTER — Other Ambulatory Visit: Payer: Self-pay | Admitting: Family Medicine

## 2024-02-21 DIAGNOSIS — R790 Abnormal level of blood mineral: Secondary | ICD-10-CM

## 2024-02-21 DIAGNOSIS — R7989 Other specified abnormal findings of blood chemistry: Secondary | ICD-10-CM

## 2024-02-21 DIAGNOSIS — E611 Iron deficiency: Secondary | ICD-10-CM

## 2024-03-04 ENCOUNTER — Other Ambulatory Visit: Payer: Self-pay | Admitting: Family Medicine

## 2024-03-04 DIAGNOSIS — I10 Essential (primary) hypertension: Secondary | ICD-10-CM

## 2024-03-09 ENCOUNTER — Other Ambulatory Visit: Payer: Self-pay | Admitting: *Deleted

## 2024-03-09 ENCOUNTER — Telehealth: Payer: Self-pay | Admitting: *Deleted

## 2024-03-09 ENCOUNTER — Other Ambulatory Visit: Payer: Self-pay | Admitting: Family Medicine

## 2024-03-09 MED ORDER — TIRZEPATIDE 5 MG/0.5ML ~~LOC~~ SOAJ: 5.0000 mg | SUBCUTANEOUS | 0 refills | Status: DC

## 2024-03-09 NOTE — Telephone Encounter (Signed)
 Copied from CRM [id number]. Topic: Clinical - Prescription Issue >> Mar 09, 2024  1:45 PM Thersia BROCKS wrote: Reason for CRM: Patient called regarding prescription tirzepatide  (MOUNJARO ) 5 MG/0.5ML Pen , stated that it was denied and call subscriber   Spoke with patient, notified Rx Mounjaro  to 5mg  Send to pharmacy today  [facility]

## 2024-05-06 ENCOUNTER — Ambulatory Visit (INDEPENDENT_AMBULATORY_CARE_PROVIDER_SITE_OTHER): Payer: PPO

## 2024-05-06 VITALS — Ht 71.0 in | Wt 219.0 lb

## 2024-05-06 DIAGNOSIS — E1165 Type 2 diabetes mellitus with hyperglycemia: Secondary | ICD-10-CM

## 2024-05-06 DIAGNOSIS — Z0000 Encounter for general adult medical examination without abnormal findings: Secondary | ICD-10-CM

## 2024-05-06 NOTE — Progress Notes (Signed)
 Subjective:   Thomas Stuart is a 73 y.o. who presents for a Medicare Wellness preventive visit.  As a reminder, Annual Wellness Visits don't include a physical exam, and some assessments may be limited, especially if this visit is performed virtually. We may recommend an in-person follow-up visit with your provider if needed.  Visit Complete: Virtual I connected with  Thomas Stuart on 05/06/24 by a audio enabled telemedicine application and verified that I am speaking with the correct person using two identifiers.  Patient Location: Home  Provider Location: Home Office  I discussed the limitations of evaluation and management by telemedicine. The patient expressed understanding and agreed to proceed.  Vital Signs: Because this visit was a virtual/telehealth visit, some criteria may be missing or patient reported. Any vitals not documented were not able to be obtained and vitals that have been documented are patient reported.  VideoError- Librarian, academic were attempted between this provider and patient, however failed, due to patient having technical difficulties OR patient did not have access to video capability.  We continued and completed visit with audio only.   Persons Participating in Visit: Patient.  AWV Questionnaire: No: Patient Medicare AWV questionnaire was not completed prior to this visit.  Cardiac Risk Factors include: advanced age (>18men, >20 women);hypertension;diabetes mellitus;dyslipidemia;male gender;obesity (BMI >30kg/m2)     Objective:    Today's Vitals   05/06/24 1006  Weight: 219 lb (99.3 kg)  Height: 5' 11 (1.803 m)   Body mass index is 30.54 kg/m.     05/06/2024   10:11 AM 04/16/2022    9:04 AM 04/06/2021    3:27 PM 03/29/2020    9:40 AM 11/20/2018   10:51 AM 06/25/2016    3:37 PM 06/16/2014   12:44 PM  Advanced Directives  Does Patient Have a Medical Advance Directive? No No No No No No  No   Does patient want to  make changes to medical advance directive?   Yes (MAU/Ambulatory/Procedural Areas - Information given)      Would patient like information on creating a medical advance directive? No - Patient declined No - Patient declined  No - Patient declined Yes (MAU/Ambulatory/Procedural Areas - Information given)  No - patient declined information  No - patient declined information      Data saved with a previous flowsheet row definition    Current Medications (verified) Outpatient Encounter Medications as of 05/06/2024  Medication Sig   atorvastatin  (LIPITOR) 20 MG tablet Take 1 tablet (20 mg total) by mouth daily.   benazepril -hydrochlorthiazide (LOTENSIN  HCT) 10-12.5 MG tablet TAKE 1 TABLET BY MOUTH EVERY DAY   Coenzyme Q10 (COQ10) 400 MG CAPS Take 1 capsule by mouth daily.   Multiple Vitamin (ONE-A-DAY MENS PO) Take by mouth.   tadalafil (CIALIS) 20 MG tablet Take 20 mg by mouth as directed.   tirzepatide  (MOUNJARO ) 5 MG/0.5ML Pen Inject 5 mg into the skin once a week.   VITAMIN D PO Take by mouth.   vitamin E 1000 UNIT capsule Take 1,000 Units by mouth daily.   No facility-administered encounter medications on file as of 05/06/2024.    Allergies (verified) Patient has no known allergies.   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   PSA elevation    Urinary retention    Past Surgical History:  Procedure Laterality Date   APPENDECTOMY     COLONOSCOPY  2004, 03/2008, 06/15/2011, 06/2016   2004 - 6 mm polyp, 2009 4 adenomas max 10 mm 2012 - no polyps 2017 diminutive adenoma   CYSTOSCOPY  2010   with fulgeration   CYSTOSCOPY  2006   w/ stone removal    CYSTOSCOPY N/A 04/15/2014   Procedure: CYSTOSCOPY , WITH PROSTATE  BIOPSY;  Surgeon: Norleen JINNY Seltzer, MD;  Location: [facility];  Service: Urology;  Laterality: N/A;   INGUINAL HERNIA REPAIR  2011   left   LITHOTRIPSY  2008   kidney stone   LYMPHADENECTOMY Bilateral 06/14/2014   Procedure: LYMPHADENECTOMY;  Surgeon: Gretel Ferrara, MD;  Location: [facility];  Service: Urology;  Laterality: Bilateral;   POLYPECTOMY     PROSTATE BIOPSY  01/2007   benign, TURP   PROSTATE BIOPSY N/A 04/15/2014   Procedure: PROSTATE ULTRASOUND/BIOPSY ;  Surgeon: Norleen JINNY Seltzer, MD;  Location: [facility];  Service: Urology;  Laterality: N/A;   PROSTATE SURGERY     ROBOT ASSISTED LAPAROSCOPIC RADICAL PROSTATECTOMY N/A 06/14/2014   Procedure: ROBOTIC ASSISTED LAPAROSCOPIC RADICAL PROSTATECTOMY LEVEL 3;  Surgeon: Gretel Ferrara, MD;  Location: [facility];  Service: Urology;  Laterality: N/A;   TONSILLECTOMY     TRANSURETHRAL RESECTION OF PROSTATE  2007   Family History  Problem Relation Age of Onset   Breast cancer Mother 1   Dementia Mother    Transient ischemic attack Mother    Healthy Sister    Healthy Brother    Colon cancer Neg Hx    Esophageal cancer Neg Hx    Rectal cancer Neg Hx    Stomach cancer Neg Hx    Social History   Socioeconomic History   Marital status: Single    Spouse name: Not on file   Number of children: Not on file   Years of education: Not on file   Highest education level: Not on file  Occupational History   Occupation: reired  Tobacco Use   Smoking status: Never   Smokeless tobacco: Never  Vaping Use   Vaping status: Never Used  Substance and Sexual Activity   Alcohol use: No   Drug use: No   Sexual activity: Yes  Other Topics Concern   Not on file  Social History Narrative   Not on file   Social Drivers of Health   Financial Resource Strain: Low Risk  (05/06/2024)   Overall Financial Resource Strain (CARDIA)    Difficulty of Paying Living  Expenses: Not hard at all  Food Insecurity: No Food Insecurity (05/06/2024)   Hunger Vital Sign    Worried About Running Out of Food in the Last Year: Never true    Ran Out of Food in the Last Year: Never true  Transportation Needs: No Transportation Needs (05/06/2024)   PRAPARE - Administrator, Civil Service (Medical): No    Lack of Transportation (Non-Medical): No  Physical Activity: Sufficiently Active (05/06/2024)   Exercise Vital Sign    Days of Exercise per Week: 5 days    Minutes of Exercise per Session: 120 min  Stress: No Stress Concern Present (05/06/2024)   [employer] of Occupational Health -  Occupational Stress Questionnaire    Feeling of Stress: Not at all  Social Connections: Moderately Isolated (05/06/2024)   Social Connection and Isolation Panel    Frequency of Communication with Friends and Family: More than three times a week    Frequency of Social Gatherings with Friends and Family: More than three times a week    Attends Religious Services: More than 4 times per year    Active Member of [employer] or Organizations: No    Attends Engineer, structural: Never    Marital Status: Never married    Tobacco Counseling Counseling given: Not Answered    Clinical Intake:  Pre-visit preparation completed: Yes  Pain : No/denies pain     BMI - recorded: 30.54 Nutritional Status: BMI > 30  Obese Diabetes: Yes CBG done?: No Did pt. bring in CBG monitor from home?: No  Lab Results  Component Value Date   HGBA1C 7.0 (H) 02/13/2024   HGBA1C 6.1 (A) 05/20/2023   HGBA1C 6.5 02/12/2023     How often do you need to have someone help you when you read instructions, pamphlets, or other written materials from your doctor or pharmacy?: 1 - Never  Interpreter Needed?: No  Information entered by :: Ellouise Haws, LPN   Activities of Daily Living     05/06/2024   10:08 AM  In your present state of health, do you have any difficulty performing the  following activities:  Hearing? 0  Vision? 0  Difficulty concentrating or making decisions? 0  Walking or climbing stairs? 0  Dressing or bathing? 0  Doing errands, shopping? 0  Preparing Food and eating ? N  Using the Toilet? N  In the past six months, have you accidently leaked urine? N  Do you have problems with loss of bowel control? N  Managing your Medications? N  Managing your Finances? N  Housekeeping or managing your Housekeeping? N    Patient Care Team: Kennyth Worth HERO, MD as PCP - General (Family Medicine) Renda Glance, MD as Consulting Physician (Urology) Elspeth Lauraine DEL, OD as Referring Physician (Ophthalmology)  I have updated your Care Teams any recent Medical Services you may have received from other providers in the past year.     Assessment:   This is a routine wellness examination for Kairon.  Hearing/Vision screen Hearing Screening - Comments:: Pt denies any hearing issues  Vision Screening - Comments:: Wears rx glasses - up to date with routine eye exams with Dr Elspeth [facility]    Goals Addressed             This Visit's Progress    Patient Stated       Continue losing weight        Depression Screen     05/06/2024   10:11 AM 02/13/2024    8:45 AM 05/20/2023    9:09 AM 02/12/2023    8:50 AM 04/16/2022    9:03 AM 02/08/2022    8:45 AM 02/08/2022    8:44 AM  PHQ 2/9 Scores  PHQ - 2 Score 0 0 0 0 0 0 0  PHQ- 9 Score  0         Fall Risk     05/06/2024   10:13 AM 02/13/2024    8:44 AM 05/20/2023    9:09 AM 02/12/2023    8:50 AM 04/16/2022    9:06 AM  Fall Risk   Falls in the past year? 0 0 0 0 0  Number  falls in past yr: 0 0 0 0 0  Injury with Fall? 0 0 0 0 0  Risk for fall due to : No Fall Risks No Fall Risks No Fall Risks No Fall Risks Impaired vision  Follow up Falls prevention discussed Falls evaluation completed   Falls prevention discussed      Data saved with a previous flowsheet row definition    MEDICARE RISK AT HOME:   Medicare Risk at Home Any stairs in or around the home?: Yes If so, are there any without handrails?: No Home free of loose throw rugs in walkways, pet beds, electrical cords, etc?: Yes Adequate lighting in your home to reduce risk of falls?: Yes Life alert?: No Use of a cane, walker or w/c?: No Grab bars in the bathroom?: No Shower chair or bench in shower?: No Elevated toilet seat or a handicapped toilet?: No  TIMED UP AND GO:  Was the test performed?  No  Cognitive Function: 6CIT completed    11/20/2018    1:23 PM  MMSE - Mini Mental State Exam  Orientation to time 5  Orientation to Place 5  Registration 3  Attention/ Calculation 5  Recall 2  Language- name 2 objects 2  Language- repeat 1  Language- follow 3 step command 3  Language- read & follow direction 1  Write a sentence 1  Copy design 1  Total score 29        05/06/2024   10:14 AM 04/16/2022    9:08 AM 04/06/2021    3:32 PM  6CIT Screen  What Year? 0 points 0 points 0 points  What month? 0 points 0 points 0 points  What time? 0 points 0 points 0 points  Count back from 20 0 points 0 points 0 points  Months in reverse 0 points 0 points 0 points  Repeat phrase 0 points 0 points 0 points  Total Score 0 points 0 points 0 points    Immunizations Immunization History  Administered Date(s) Administered    sv, Bivalent, Protein Subunit Rsvpref,pf (Abrysvo) 11/15/2022   Fluad Quad(high Dose 65+) 05/13/2019, 05/16/2022   INFLUENZA, HIGH DOSE SEASONAL PF 04/29/2017, 05/27/2018, 04/11/2023, 04/24/2024   Influenza,inj,Quad PF,6+ Mos 06/06/2016, 05/15/2017   Influenza-Unspecified 06/06/2016   PFIZER(Purple Top)SARS-COV-2 Vaccination 10/02/2019, 10/28/2019, 05/10/2020, 12/04/2020   PNEUMOCOCCAL CONJUGATE-20 11/15/2022   Pfizer Covid-19 Vaccine Bivalent Booster 6yrs & up 04/17/2021, 12/07/2021   Pfizer(Comirnaty)Fall Seasonal Vaccine 12 years and older 06/12/2022, 04/11/2023   Pneumococcal Conjugate-13 08/15/2017,  05/27/2018   Tdap 10/26/2009, 10/16/2023   Zoster Recombinant(Shingrix) 09/18/2018, 01/05/2019    Screening Tests Health Maintenance  Topic Date Due   Diabetic kidney evaluation - Urine ACR  Never done   FOOT EXAM  01/23/2019   COVID-19 Vaccine (9 - Pfizer risk 2024-25 season) 04/06/2024   Colonoscopy  07/19/2024   HEMOGLOBIN A1C  08/15/2024   OPHTHALMOLOGY EXAM  10/15/2024   Diabetic kidney evaluation - eGFR measurement  02/12/2025   Medicare Annual Wellness (AWV)  05/06/2025   Pneumococcal Vaccine: 50+ Years  Completed   Influenza Vaccine  Completed   Hepatitis C Screening  Completed   Zoster Vaccines- Shingrix  Completed   HPV VACCINES  Aged Out   Meningococcal B Vaccine  Aged Out   DTaP/Tdap/Td  Discontinued    Health Maintenance Items Addressed: See Nurse Notes at the end of this note  Additional Screening:  Vision Screening: Recommended annual ophthalmology exams for early detection of glaucoma and other disorders of the eye. Is  the patient up to date with their annual eye exam?  Yes  Who is the provider or what is the name of the office in which the patient attends annual eye exams? Dr Elspeth Dadds   Dental Screening: Recommended annual dental exams for proper oral hygiene  Community Resource Referral / Chronic Care Management: CRR required this visit?  No   CCM required this visit?  No   Plan:    I have personally reviewed and noted the following in the patient's chart:   Medical and social history Use of alcohol, tobacco or illicit drugs  Current medications and supplements including opioid prescriptions. Patient is not currently taking opioid prescriptions. Functional ability and status Nutritional status Physical activity Advanced directives List of other physicians Hospitalizations, surgeries, and ER visits in previous 12 months Vitals Screenings to include cognitive, depression, and falls Referrals and appointments  In addition, I have  reviewed and discussed with patient certain preventive protocols, quality metrics, and best practice recommendations. A written personalized care plan for preventive services as well as general preventive health recommendations were provided to patient.   Ellouise VEAR Haws, LPN   [id number]   After Visit Summary: (MyChart) Due to this being a telephonic visit, the after visit summary with patients personalized plan was offered to patient via MyChart   Notes: Nothing significant to report at this time.

## 2024-05-06 NOTE — Patient Instructions (Signed)
 Thomas Stuart,  Thank you for taking the time for your Medicare Wellness Visit. I appreciate your continued commitment to your health goals. Please review the care plan we discussed, and feel free to reach out if I can assist you further.  Medicare recommends these wellness visits once per year to help you and your care team stay ahead of potential health issues. These visits are designed to focus on prevention, allowing your provider to concentrate on managing your acute and chronic conditions during your regular appointments.  Please note that Annual Wellness Visits do not include a physical exam. Some assessments may be limited, especially if the visit was conducted virtually. If needed, we may recommend a separate in-person follow-up with your provider.  Ongoing Care Seeing your primary care provider every 3 to 6 months helps us  monitor your health and provide consistent, personalized care.   Referrals If a referral was made during today's visit and you haven't received any updates within two weeks, please contact the referred provider directly to check on the status.  Recommended Screenings:  Health Maintenance  Topic Date Due   Yearly kidney health urinalysis for diabetes  Never done   Complete foot exam   01/23/2019   Medicare Annual Wellness Visit  04/17/2023   COVID-19 Vaccine (9 - Pfizer risk 2024-25 season) 04/06/2024   Colon Cancer Screening  07/19/2024   Hemoglobin A1C  08/15/2024   Eye exam for diabetics  10/15/2024   Yearly kidney function blood test for diabetes  02/12/2025   Pneumococcal Vaccine for age over 71  Completed   Flu Shot  Completed   Hepatitis C Screening  Completed   Zoster (Shingles) Vaccine  Completed   HPV Vaccine  Aged Out   Meningitis B Vaccine  Aged Out   DTaP/Tdap/Td vaccine  Discontinued       04/16/2022    9:04 AM  Advanced Directives  Does Patient Have a Medical Advance Directive? No  Would patient like information on creating a medical  advance directive? No - Patient declined   Advance Care Planning is important because it: Ensures you receive medical care that aligns with your values, goals, and preferences. Provides guidance to your family and loved ones, reducing the emotional burden of decision-making during critical moments.  Vision: Annual vision screenings are recommended for early detection of glaucoma, cataracts, and diabetic retinopathy. These exams can also reveal signs of chronic conditions such as diabetes and high blood pressure.  Dental: Annual dental screenings help detect early signs of oral cancer, gum disease, and other conditions linked to overall health, including heart disease and diabetes.  Please see the attached documents for additional preventive care recommendations.

## 2024-05-12 DIAGNOSIS — M25561 Pain in right knee: Secondary | ICD-10-CM | POA: Diagnosis not present

## 2024-05-20 ENCOUNTER — Encounter: Payer: Self-pay | Admitting: Gastroenterology

## 2024-05-20 ENCOUNTER — Ambulatory Visit: Admitting: Gastroenterology

## 2024-05-20 VITALS — BP 122/82 | HR 95 | Ht 71.0 in | Wt 228.0 lb

## 2024-05-20 DIAGNOSIS — Z860100 Personal history of colon polyps, unspecified: Secondary | ICD-10-CM | POA: Diagnosis not present

## 2024-05-20 DIAGNOSIS — D509 Iron deficiency anemia, unspecified: Secondary | ICD-10-CM | POA: Diagnosis not present

## 2024-05-20 MED ORDER — NA SULFATE-K SULFATE-MG SULF 17.5-3.13-1.6 GM/177ML PO SOLN: 1.0000 | Freq: Once | ORAL | 0 refills | Status: AC

## 2024-05-20 NOTE — Patient Instructions (Signed)
 You have been scheduled for an endoscopy and colonoscopy. Please follow the written instructions given to you at your visit today.  If you use inhalers (even only as needed), please bring them with you on the day of your procedure.  DO NOT TAKE 7 DAYS PRIOR TO TEST- Trulicity (dulaglutide) Ozempic, Wegovy (semaglutide) Mounjaro (tirzepatide) Bydureon Bcise (exanatide extended release)  DO NOT TAKE 1 DAY PRIOR TO YOUR TEST Rybelsus (semaglutide) Adlyxin (lixisenatide) Victoza (liraglutide) Byetta (exanatide) ________________________________________________________________________

## 2024-05-20 NOTE — Progress Notes (Signed)
 05/20/2024 Thomas Stuart [id number] 30-May-1951   Discussed the use of AI scribe software for clinical note transcription with the patient, who gave verbal consent to proceed.  History of Present Illness Thomas Stuart is a 73 year old male who presents for evaluation of iron deficiency anemia, rule out potential gastrointestinal blood loss. He was referred by his family doctor for evaluation of low hemoglobin and iron levels.  Patient of Dr. Darilyn.  He has experienced a decline in hemoglobin levels, currently around 12 g/dL, from 16 g/dL in July of the previous year, along with low iron levels. He takes 65 mg of iron twice a week. No blood in stool or stools that appear thick, black, or tarry. He is due for a colonoscopy in December and has had multiple colonoscopies in the past, with the last one in 2022 revealing nine polyps. No heartburn, reflux, or indigestion.  He recently began taking meloxicam five days ago for knee discomfort that started suddenly two weeks ago. He reports that the doctor who reviewed his knee x-ray told him that everything looked good. The knee discomfort has improved and is now only a 'little nagging discomfort'.  No long-term NSAIDs.  He has been on Mounjaro  injections since July and has lost 10-12 pounds. He weighs himself daily and reports a weight of 220 pounds at home, though it was 228 pounds at the clinic due to clothing and other items. No abdominal pain and maintains a good appetite.  Vitamin B12 levels are normal.   Colonoscopy December 2022 with 8 tubular adenomas and 1 hyperplastic polyp removed.  Repeat recommended in 3 years.   Past Medical History:  Diagnosis Date   Arthritis    lower back   BPH (benign prostatic hyperplasia)    Cataract    Erectile dysfunction    Hematuria    Hyperlipidemia    Hypertension    Lumbar disc disease    Nephrolithiasis    Personal history of colonic polyps 2004, 2009, 2017   adenomas   Prostate  cancer (HCC) 04/15/2014   Gleason 4+3=7, volume 70 mL   PSA elevation    Urinary retention    Past Surgical History:  Procedure Laterality Date   APPENDECTOMY     COLONOSCOPY  2004, 03/2008, 06/15/2011, 06/2016   2004 - 6 mm polyp, 2009 4 adenomas max 10 mm 2012 - no polyps 2017 diminutive adenoma   CYSTOSCOPY  2010   with fulgeration   CYSTOSCOPY  2006   w/ stone removal   CYSTOSCOPY N/A 04/15/2014   Procedure: CYSTOSCOPY , WITH PROSTATE  BIOPSY;  Surgeon: Norleen JINNY Seltzer, MD;  Location: [facility];  Service: Urology;  Laterality: N/A;   INGUINAL HERNIA REPAIR  2011   left   LITHOTRIPSY  2008   kidney stone   LYMPHADENECTOMY Bilateral 06/14/2014   Procedure: LYMPHADENECTOMY;  Surgeon: Gretel Ferrara, MD;  Location: [facility];  Service: Urology;  Laterality: Bilateral;   POLYPECTOMY     PROSTATE BIOPSY  01/2007   benign, TURP   PROSTATE BIOPSY N/A 04/15/2014   Procedure: PROSTATE ULTRASOUND/BIOPSY ;  Surgeon: Norleen JINNY Seltzer, MD;  Location: [facility];  Service: Urology;  Laterality: N/A;   PROSTATE SURGERY     ROBOT ASSISTED LAPAROSCOPIC RADICAL PROSTATECTOMY N/A 06/14/2014   Procedure: ROBOTIC ASSISTED LAPAROSCOPIC RADICAL PROSTATECTOMY LEVEL 3;  Surgeon: Gretel Ferrara, MD;  Location: [facility];  Service: Urology;  Laterality: N/A;   TONSILLECTOMY  TRANSURETHRAL RESECTION OF PROSTATE  2007    reports that he has never smoked. He has never used smokeless tobacco. He reports that he does not drink alcohol and does not use drugs. family history includes Breast cancer (age of onset: 67) in his mother; Dementia in his mother; Healthy in his brother and sister; Transient ischemic attack in his mother. No Known Allergies    Outpatient Encounter Medications as of 05/20/2024  Medication Sig   atorvastatin  (LIPITOR) 20 MG tablet Take 1 tablet (20 mg total) by mouth daily.   benazepril -hydrochlorthiazide (LOTENSIN  HCT) 10-12.5 MG tablet TAKE 1 TABLET BY MOUTH EVERY  DAY   Coenzyme Q10 (COQ10) 400 MG CAPS Take 1 capsule by mouth daily.   ferrous sulfate 324 MG TBEC Take 324 mg by mouth 2 (two) times a week.   meloxicam (MOBIC) 15 MG tablet Take 1 tablet every day by oral route with meal(s), for 2 weeks then as needed.   Multiple Vitamin (ONE-A-DAY MENS PO) Take by mouth.   tirzepatide  (MOUNJARO ) 5 MG/0.5ML Pen Inject 5 mg into the skin once a week.   VITAMIN D PO Take by mouth.   vitamin E 1000 UNIT capsule Take 1,000 Units by mouth daily.   [DISCONTINUED] tadalafil (CIALIS) 20 MG tablet Take 20 mg by mouth as directed.   No facility-administered encounter medications on file as of 05/20/2024.     REVIEW OF SYSTEMS  : All other systems reviewed and negative except where noted in the History of Present Illness.   PHYSICAL EXAM: BP 122/82   Pulse 95   Ht 5' 11 (1.803 m)   Wt 228 lb (103.4 kg)   BMI 31.80 kg/m  General: Well developed AA male in no acute distress Head: Normocephalic and atraumatic Eyes:  Sclerae anicteric, conjunctiva pink. Ears: Normal auditory acuity Lungs: Clear throughout to auscultation; no W/R/R. Heart: Regular rate and rhythm; no M/R/G. Rectal:  Will be done at the time of colonoscopy. Musculoskeletal: Symmetrical with no gross deformities  Skin: No lesions on visible extremities Extremities: No edema  Neurological: Alert oriented x 4, grossly non-focal Psychological:  Alert and cooperative. Normal mood and affect  Assessment & Plan Iron deficiency anemia Iron deficiency anemia with hemoglobin levels decreasing from 15-16 grams to 12 grams over the past year. Iron levels are low, likely contributing to the anemia. Differential diagnosis includes blood loss, dietary deficiency, or malabsorption. No evidence of gastrointestinal bleeding based on current symptoms. Current iron supplementation is 65 mg twice a week, which may be insufficient. - Schedule colonoscopy and upper endoscopy to evaluate for potential sources of  gastrointestinal bleeding. - Continue current iron supplementation regimen of 65 mg twice a week. - Monitor hemoglobin and iron levels through primary care follow-up. - Educate on potential side effects of iron supplementation, including dark stools. - Discuss potential need to increase iron supplementation frequency if current regimen is ineffective.  Personal history of colon polyps with last colonoscopy in December 2022 with 8 tubular adenomas removed.  Repeat recommended in 3 years.  Plan for colonoscopy with Dr. Avram as above.  **The risks, benefits, and alternatives to EGD and colonoscopy were discussed with the patient and he consents to proceed.       CC:  Kennyth Worth HERO, MD

## 2024-06-02 ENCOUNTER — Other Ambulatory Visit: Payer: Self-pay | Admitting: *Deleted

## 2024-06-02 MED ORDER — TIRZEPATIDE 7.5 MG/0.5ML ~~LOC~~ SOAJ: 7.5000 mg | SUBCUTANEOUS | 1 refills | Status: DC

## 2024-06-17 ENCOUNTER — Encounter: Payer: Self-pay | Admitting: Internal Medicine

## 2024-06-23 NOTE — Progress Notes (Unsigned)
 [facility] History and Physical   Primary Care Physician:  Kennyth Worth HERO, MD   Reason for Procedure:    Encounter Diagnosis  Name Primary?   Iron deficiency anemia, unspecified iron deficiency anemia type Yes     Plan:    EGD and colonoscopy   The patient was provided an opportunity to ask questions and all were answered. The patient agreed with the plan.   HPI: Thomas Stuart is a 73 y.o. male here to evaluate iron deficiency anemia.     Latest Ref Rng & Units 02/18/2024   11:27 AM 02/13/2024    9:21 AM 02/12/2023    9:24 AM  CBC  WBC 4.0 - 10.5 K/uL 9.0  8.8  10.3   Hemoglobin 13.0 - 17.0 g/dL 87.7  87.8  83.9   Hematocrit 39.0 - 52.0 % 38.5  38.9  48.9   Platelets 150.0 - 400.0 K/uL 316.0  316.0  313.0    Lab Results  Component Value Date   IRON 30 (L) 02/18/2024   TIBC 441 (H) 02/18/2024   FERRITIN 4 (L) 02/18/2024       Colonoscopy December 2022 with 8 tubular adenomas and 1 hyperplastic polyp removed.  Repeat recommended in 3 years. 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   PSA elevation    Urinary retention     Past Surgical History:  Procedure Laterality Date   APPENDECTOMY     COLONOSCOPY  2004, 03/2008, 06/15/2011, 06/2016   2004 - 6 mm polyp, 2009 4 adenomas max 10 mm 2012 - no polyps 2017 diminutive adenoma   CYSTOSCOPY  2010   with fulgeration   CYSTOSCOPY  2006   w/ stone removal   CYSTOSCOPY N/A 04/15/2014   Procedure: CYSTOSCOPY , WITH PROSTATE  BIOPSY;  Surgeon: Norleen JINNY Seltzer, MD;  Location: [facility];  Service: Urology;  Laterality: N/A;   INGUINAL HERNIA REPAIR  2011   left   LITHOTRIPSY  2008    kidney stone   LYMPHADENECTOMY Bilateral 06/14/2014   Procedure: LYMPHADENECTOMY;  Surgeon: Gretel Ferrara, MD;  Location: [facility];  Service: Urology;  Laterality: Bilateral;   POLYPECTOMY     PROSTATE BIOPSY  01/2007   benign, TURP   PROSTATE BIOPSY N/A 04/15/2014   Procedure: PROSTATE ULTRASOUND/BIOPSY ;  Surgeon: Norleen JINNY Seltzer, MD;  Location[facility];  Service: Urology;  Laterality: N/A;   PROSTATE SURGERY     ROBOT ASSISTED LAPAROSCOPIC RADICAL PROSTATECTOMY N/A 06/14/2014   Procedure: ROBOTIC ASSISTED LAPAROSCOPIC RADICAL PROSTATECTOMY LEVEL 3;  Surgeon: Gretel Ferrara, MD;  Location: [facility];  Service: Urology;  Laterality: N/A;   TONSILLECTOMY     TRANSURETHRAL RESECTION OF PROSTATE  2007     Current Outpatient Medications  Medication Sig Dispense Refill   atorvastatin  (LIPITOR) 20 MG tablet Take 1 tablet (20 mg total) by mouth daily. 90 tablet 3   benazepril -hydrochlorthiazide (LOTENSIN  HCT) 10-12.5 MG tablet TAKE 1 TABLET BY MOUTH EVERY DAY 90 tablet 3   Coenzyme Q10 (COQ10) 400 MG CAPS Take 1 capsule by mouth daily.  ferrous sulfate 324 MG TBEC Take 324 mg by mouth 2 (two) times a week.     meloxicam (MOBIC) 15 MG tablet Take 1 tablet every day by oral route with meal(s), for 2 weeks then as needed.     Multiple Vitamin (ONE-A-DAY MENS PO) Take by mouth.     tirzepatide  (MOUNJARO ) 7.5 MG/0.5ML Pen Inject 7.5 mg into the skin once a week. 6 mL 1   VITAMIN D PO Take by mouth.     vitamin E 1000 UNIT capsule Take 1,000 Units by mouth daily.     Current Facility-Administered Medications  Medication Dose Route Frequency Provider Last Rate Last Admin   0.9 %  sodium chloride  infusion  500 mL Intravenous Continuous Avram Lupita BRAVO, MD        Allergies as of 06/24/2024   (No Known Allergies)    Family History  Problem Relation Age of Onset   Breast cancer Mother 37   Dementia Mother    Transient ischemic attack Mother    Healthy Sister    Healthy Brother     Colon cancer Neg Hx    Esophageal cancer Neg Hx    Rectal cancer Neg Hx    Stomach cancer Neg Hx     Social History   Socioeconomic History   Marital status: Single    Spouse name: Not on file   Number of children: Not on file   Years of education: Not on file   Highest education level: Not on file  Occupational History   Occupation: reired  Tobacco Use   Smoking status: Never   Smokeless tobacco: Never  Vaping Use   Vaping status: Never Used  Substance and Sexual Activity   Alcohol use: No   Drug use: No   Sexual activity: Yes  Other Topics Concern   Not on file  Social History Narrative   Not on file   Social Drivers of Health   Financial Resource Strain: Low Risk  (05/06/2024)   Overall Financial Resource Strain (CARDIA)    Difficulty of Paying Living Expenses: Not hard at all  Food Insecurity: No Food Insecurity (05/06/2024)   Hunger Vital Sign    Worried About Running Out of Food in the Last Year: Never true    Ran Out of Food in the Last Year: Never true  Transportation Needs: No Transportation Needs (05/06/2024)   PRAPARE - Administrator, Civil Service (Medical): No    Lack of Transportation (Non-Medical): No  Physical Activity: Sufficiently Active (05/06/2024)   Exercise Vital Sign    Days of Exercise per Week: 5 days    Minutes of Exercise per Session: 120 min  Stress: No Stress Concern Present (05/06/2024)   [employer] of Occupational Health - Occupational Stress Questionnaire    Feeling of Stress: Not at all  Social Connections: Moderately Isolated (05/06/2024)   Social Connection and Isolation Panel    Frequency of Communication with Friends and Family: More than three times a week    Frequency of Social Gatherings with Friends and Family: More than three times a week    Attends Religious Services: More than 4 times per year    Active Member of [employer] or Organizations: No    Attends Banker Meetings: Never    Marital  Status: Never married  Intimate Partner Violence: Not At Risk (05/06/2024)   Humiliation, Afraid, Rape, and Kick questionnaire    Fear of Current or Ex-Partner: No  Emotionally Abused: No    Physically Abused: No    Sexually Abused: No    Review of Systems:  All other review of systems negative except as mentioned in the HPI.  Physical Exam: Vital signs BP (!) 165/108   Pulse 68   Temp 98.4 F (36.9 C) (Temporal)   Resp 16   Ht 5' 11 (1.803 m)   Wt 228 lb (103.4 kg)   SpO2 96%   BMI 31.80 kg/m   General:   Alert,  Well-developed, well-nourished, pleasant and cooperative in NAD Lungs:  Clear throughout to auscultation.   Heart:  Regular rate and rhythm; no murmurs, clicks, rubs,  or gallops. Abdomen:  Soft, nontender and nondistended. Normal bowel sounds.   Neuro/Psych:  Alert and cooperative. Normal mood and affect. A and O x 3   @Ransom Nickson  CHARLENA [name], MD, [facility] [phone] (pager) 06/24/2024 10:48 AM@

## 2024-06-24 ENCOUNTER — Other Ambulatory Visit (INDEPENDENT_AMBULATORY_CARE_PROVIDER_SITE_OTHER)

## 2024-06-24 ENCOUNTER — Ambulatory Visit: Admitting: Internal Medicine

## 2024-06-24 ENCOUNTER — Encounter: Payer: Self-pay | Admitting: Internal Medicine

## 2024-06-24 VITALS — BP 143/80 | HR 90 | Temp 98.4°F | Resp 17 | Ht 71.0 in | Wt 228.0 lb

## 2024-06-24 DIAGNOSIS — K449 Diaphragmatic hernia without obstruction or gangrene: Secondary | ICD-10-CM

## 2024-06-24 DIAGNOSIS — E785 Hyperlipidemia, unspecified: Secondary | ICD-10-CM | POA: Diagnosis not present

## 2024-06-24 DIAGNOSIS — K3189 Other diseases of stomach and duodenum: Secondary | ICD-10-CM | POA: Diagnosis not present

## 2024-06-24 DIAGNOSIS — D509 Iron deficiency anemia, unspecified: Secondary | ICD-10-CM | POA: Diagnosis not present

## 2024-06-24 DIAGNOSIS — B9681 Helicobacter pylori [H. pylori] as the cause of diseases classified elsewhere: Secondary | ICD-10-CM

## 2024-06-24 DIAGNOSIS — K648 Other hemorrhoids: Secondary | ICD-10-CM | POA: Diagnosis not present

## 2024-06-24 DIAGNOSIS — K317 Polyp of stomach and duodenum: Secondary | ICD-10-CM

## 2024-06-24 DIAGNOSIS — K2950 Unspecified chronic gastritis without bleeding: Secondary | ICD-10-CM | POA: Diagnosis not present

## 2024-06-24 DIAGNOSIS — Z860100 Personal history of colon polyps, unspecified: Secondary | ICD-10-CM

## 2024-06-24 DIAGNOSIS — K2970 Gastritis, unspecified, without bleeding: Secondary | ICD-10-CM | POA: Diagnosis not present

## 2024-06-24 DIAGNOSIS — D123 Benign neoplasm of transverse colon: Secondary | ICD-10-CM | POA: Diagnosis not present

## 2024-06-24 DIAGNOSIS — I10 Essential (primary) hypertension: Secondary | ICD-10-CM | POA: Diagnosis not present

## 2024-06-24 LAB — CBC
HCT: 43.4 % (ref 39.0–52.0)
Hemoglobin: 14.3 g/dL (ref 13.0–17.0)
MCHC: 33 g/dL (ref 30.0–36.0)
MCV: 82 fl (ref 78.0–100.0)
Platelets: 288 K/uL (ref 150.0–400.0)
RBC: 5.29 Mil/uL (ref 4.22–5.81)
RDW: 18 % — ABNORMAL HIGH (ref 11.5–15.5)
WBC: 9 K/uL (ref 4.0–10.5)

## 2024-06-24 LAB — FERRITIN: Ferritin: 13.7 ng/mL — ABNORMAL LOW (ref 22.0–322.0)

## 2024-06-24 MED ORDER — SODIUM CHLORIDE 0.9 % IV SOLN: 500.0000 mL | INTRAVENOUS | Status: DC

## 2024-06-24 NOTE — Patient Instructions (Addendum)
 There was some red color change in the stomach, I took biopsies to see if there was an infection and I also took biopsies of your upper small intestine to see if you have a condition leading to poor absorption of iron. 2 tiny stomach polyps were seen and removed also. A small hiatal hernia also - stomach moves from abdomen into chest cavity some. This is common and I do not think it is a significant problem.  Colonoscopy showed only 2 very small polyps and internal hemorrhoids.  Once I see the pathology reports I will contact you with recommendations and plans.  We will check your blood count and iron levels today.  I appreciate the opportunity to care for you. Thomas CHARLENA [name], MD, FACG  YOU HAD AN ENDOSCOPIC PROCEDURE TODAY AT THE [facility]:   Refer to the procedure report that was given to you for any specific questions about what was found during the examination.  If the procedure report does not answer your questions, please call your gastroenterologist to clarify.  If you requested that your care partner not be given the details of your procedure findings, then the procedure report has been included in a sealed envelope for you to review at your convenience later.  YOU SHOULD EXPECT: Some feelings of bloating in the abdomen. Passage of more gas than usual.  Walking can help get rid of the air that was put into your GI tract during the procedure and reduce the bloating. If you had a lower endoscopy (such as a colonoscopy or flexible sigmoidoscopy) you may notice spotting of blood in your stool or on the toilet paper. If you underwent a bowel prep for your procedure, you may not have a normal bowel movement for a few days.  Please Note:  You might notice some irritation and congestion in your nose or some drainage.  This is from the oxygen used during your procedure.  There is no need for concern and it should clear up in a day or so.  SYMPTOMS TO REPORT IMMEDIATELY:  Following  lower endoscopy (colonoscopy or flexible sigmoidoscopy):  Excessive amounts of blood in the stool  Significant tenderness or worsening of abdominal pains  Swelling of the abdomen that is new, acute  Fever of 100F or higher  Following upper endoscopy (EGD)  Vomiting of blood or coffee ground material  New chest pain or pain under the shoulder blades  Painful or persistently difficult swallowing  New shortness of breath  Fever of 100F or higher  Black, tarry-looking stools  For urgent or emergent issues, a gastroenterologist can be reached at any hour by calling [phone]. Do not use MyChart messaging for urgent concerns.    DIET:  We do recommend a small meal at first, but then you may proceed to your regular diet.  Drink plenty of fluids but you should avoid alcoholic beverages for 24 hours.  ACTIVITY:  You should plan to take it easy for the rest of today and you should NOT DRIVE or use heavy machinery until tomorrow (because of the sedation medicines used during the test).    FOLLOW UP: Our staff will call the number listed on your records the next business day following your procedure.  We will call around 7:15- 8:00 am to check on you and address any questions or concerns that you may have regarding the information given to you following your procedure. If we do not reach you, we will leave a message.  If any biopsies were taken you will be contacted by phone or by letter within the next 1-3 weeks.  Please call us  at [phone] if you have not heard about the biopsies in 3 weeks.    SIGNATURES/CONFIDENTIALITY: You and/or your care partner have signed paperwork which will be entered into your electronic medical record.  These signatures attest to the fact that that the information above on your After Visit Summary has been reviewed and is understood.  Full responsibility of the confidentiality of this discharge information lies with you and/or your care-partner.

## 2024-06-24 NOTE — Op Note (Signed)
 [facility] Patient Name: Thomas Stuart Procedure Date: 06/24/2024 10:45 AM MRN: [id number] Endoscopist: Lupita FORBES [name] , MD, [id number] Age: 73 Referring MD:  Date of Birth: [birth year] Gender: Male Account #: [id number] Procedure:                Colonoscopy Indications:              Iron deficiency anemia Medicines:                Monitored Anesthesia Care Procedure:                Pre-Anesthesia Assessment:                           - Prior to the procedure, a History and Physical                            was performed, and patient medications and                            allergies were reviewed. The patient's tolerance of                            previous anesthesia was also reviewed. The risks                            and benefits of the procedure and the sedation                            options and risks were discussed with the patient.                            All questions were answered, and informed consent                            was obtained. Prior Anticoagulants: The patient has                            taken no anticoagulant or antiplatelet agents. ASA                            Grade Assessment: II - A patient with mild systemic                            disease. After reviewing the risks and benefits,                            the patient was deemed in satisfactory condition to                            undergo the procedure.                           After obtaining informed consent, the colonoscope  was passed under direct vision. Throughout the                            procedure, the patient's blood pressure, pulse, and                            oxygen saturations were monitored continuously. The                            Olympus Scope SN: [id number] was introduced through                            the anus and advanced to the the cecum, identified                            by appendiceal orifice and  ileocecal valve. The                            colonoscopy was performed without difficulty. The                            patient tolerated the procedure well. The quality                            of the bowel preparation was good. The ileocecal                            valve, appendiceal orifice, and rectum were                            photographed. The bowel preparation used was SUPREP                            via split dose instruction. Scope In: 10:57:57 AM Scope Out: 11:13:11 AM Scope Withdrawal Time: 0 hours 10 minutes 3 seconds  Total Procedure Duration: 0 hours 15 minutes 14 seconds  Findings:                 The perianal and digital rectal examinations were                            normal.                           A 4 mm polyp was found in the transverse colon. The                            polyp was sessile. The polyp was removed with a                            cold snare. Resection and retrieval were complete.                            Verification of patient identification for the  specimen was done. Estimated blood loss was minimal.                           A 2 mm polyp was found in the transverse colon. The                            polyp was sessile. The polyp was removed with a                            cold biopsy forceps. Resection and retrieval were                            complete. Verification of patient identification                            for the specimen was done. Estimated blood loss was                            minimal.                           Internal hemorrhoids were found.                           The exam was otherwise without abnormality on                            direct and retroflexion views. Complications:            No immediate complications. Estimated Blood Loss:     Estimated blood loss was minimal. Impression:               - One 4 mm polyp in the transverse colon, removed                             with a cold snare. Resected and retrieved.                           - One 2 mm polyp in the transverse colon, removed                            with a cold biopsy forceps. Resected and retrieved.                           - Internal hemorrhoids.                           - The examination was otherwise normal on direct                            and retroflexion views.                           - Personal history of colonic polyps. 2004 - adenoma  2009 2 adenomas                           2012 no polyps                           06/2016 diminutive adenoma                           07/19/2021 9 diminutive polyps 8 adenomas and 1                            hyperplastic Recommendation:           - Patient has a contact number available for                            emergencies. The signs and symptoms of potential                            delayed complications were discussed with the                            patient. Return to normal activities tomorrow.                            Written discharge instructions were provided to the                            patient.                           - Resume previous diet.                           - Continue present medications.                           - Await pathology results.                           - Repeat colonoscopy is recommended for                            surveillance. The colonoscopy date will be                            determined after pathology results from today's                            exam become available for review.                           - CBC and ferritin today Lupita FORBES [name], MD 06/24/2024 11:32:18 AM This report has been signed electronically.

## 2024-06-24 NOTE — Progress Notes (Signed)
 Report given to PACU, vss

## 2024-06-24 NOTE — Progress Notes (Signed)
 Called to room to assist during endoscopic procedure.  Patient ID and intended procedure confirmed with present staff. Received instructions for my participation in the procedure from the performing physician.

## 2024-06-24 NOTE — Progress Notes (Signed)
 1045 Robinul 0.1 mg IV given due large amount of secretions upon assessment.  MD made aware, vss

## 2024-06-24 NOTE — Progress Notes (Signed)
 1057  Pt experienced laryngeal spasm with jaw thrust performed. HR > 100 with esmolol 25 mg given IV, MD updated.

## 2024-06-24 NOTE — Progress Notes (Signed)
 Pt transported to lab via w/c by tech.  Pt unable to pass gas prior to discharge.  Dr Avram aware of this and ok to discharge.  Pt states he has never been able to pass gas here in the facility but is always able to when he gets home.

## 2024-06-24 NOTE — Op Note (Signed)
 [facility] Patient Name: Thomas Stuart Procedure Date: 06/24/2024 10:45 AM MRN: [id number] Endoscopist: Lupita FORBES [name] , MD, [id number] Age: 73 Referring MD:  Date of Birth: 11/07/1950 Gender: Male Account #: [id number] Procedure:                Upper GI endoscopy Indications:              Iron deficiency anemia Medicines:                Monitored Anesthesia Care Procedure:                Pre-Anesthesia Assessment:                           - Prior to the procedure, a History and Physical                            was performed, and patient medications and                            allergies were reviewed. The patient's tolerance of                            previous anesthesia was also reviewed. The risks                            and benefits of the procedure and the sedation                            options and risks were discussed with the patient.                            All questions were answered, and informed consent                            was obtained. Prior Anticoagulants: The patient has                            taken no anticoagulant or antiplatelet agents. ASA                            Grade Assessment: II - A patient with mild systemic                            disease. After reviewing the risks and benefits,                            the patient was deemed in satisfactory condition to                            undergo the procedure.                           After obtaining informed consent, the endoscope was  passed under direct vision. Throughout the                            procedure, the patient's blood pressure, pulse, and                            oxygen saturations were monitored continuously. The                            GIF [id number] #[id number] was introduced through the                            mouth, and advanced to the second part of duodenum.                            The upper GI endoscopy was  accomplished without                            difficulty. The patient tolerated the procedure                            well. Scope In: Scope Out: Findings:                 Two diminutive sessile polyps with no stigmata of                            recent bleeding were found in the cardia. The polyp                            was removed with a cold biopsy forceps. Resection                            and retrieval were complete. Verification of                            patient identification for the specimen was done.                            Estimated blood loss was minimal.                           A 2 cm hiatal hernia was present.                           Patchy mildly erythematous mucosa without bleeding                            was found in the gastric antrum. Biopsies were                            taken with a cold forceps for histology.                            Verification  of patient identification for the                            specimen was done. Estimated blood loss was minimal.                           The exam was otherwise without abnormality.                           The cardia and gastric fundus were otherwise normal                            on retroflexion.                           Biopsies for histology were taken with a cold                            forceps in the second portion of the duodenum for                            evaluation of celiac disease. Verification of                            patient identification for the specimen was done. Complications:            No immediate complications. Estimated Blood Loss:     Estimated blood loss was minimal. Impression:               - Two gastric polyps. Resected and retrieved.                           - 2 cm hiatal hernia.                           - Erythematous mucosa in the antrum. Biopsied.                           - The examination was otherwise normal.                           -  Biopsies were taken with a cold forceps for                            evaluation of celiac disease. Recommendation:           - Continue present medications.                           - Await pathology results.                           - See the other procedure note for documentation of                            additional recommendations. Lupita FORBES [name], MD 06/24/2024 11:27:55 AM  This report has been signed electronically.

## 2024-06-24 NOTE — Progress Notes (Signed)
 1055  Pt experienced laryngeal spasm with jaw thrust performed.

## 2024-06-25 ENCOUNTER — Telehealth: Payer: Self-pay | Admitting: *Deleted

## 2024-06-25 NOTE — Telephone Encounter (Signed)
  Follow up Call-     06/24/2024    9:37 AM  Call back number  Post procedure Call Back phone  # [phone]  Permission to leave phone message Yes     Patient questions:  Do you have a fever, pain , or abdominal swelling? No. Pain Score  0 *  Have you tolerated food without any problems? Yes.    Have you been able to return to your normal activities? Yes.    Do you have any questions about your discharge instructions: Diet   No. Medications  No. Follow up visit  No.  Do you have questions or concerns about your Care? No.  Actions: * If pain score is 4 or above: No action needed, pain <4.

## 2024-06-26 LAB — SURGICAL PATHOLOGY

## 2024-06-28 ENCOUNTER — Ambulatory Visit: Payer: Self-pay | Admitting: Internal Medicine

## 2024-06-28 DIAGNOSIS — B9681 Helicobacter pylori [H. pylori] as the cause of diseases classified elsewhere: Secondary | ICD-10-CM | POA: Insufficient documentation

## 2024-06-28 MED ORDER — OMEPRAZOLE 20 MG PO CPDR: 20.0000 mg | DELAYED_RELEASE_CAPSULE | Freq: Two times a day (BID) | ORAL | 0 refills | Status: AC

## 2024-06-28 MED ORDER — DOXYCYCLINE HYCLATE 100 MG PO TABS: 100.0000 mg | ORAL_TABLET | Freq: Two times a day (BID) | ORAL | 0 refills | Status: AC

## 2024-06-28 MED ORDER — METRONIDAZOLE 250 MG PO TABS: 250.0000 mg | ORAL_TABLET | Freq: Four times a day (QID) | ORAL | 0 refills | Status: AC

## 2024-06-28 MED ORDER — BISMUTH SUBSALICYLATE 262 MG PO CHEW: 524.0000 mg | CHEWABLE_TABLET | Freq: Four times a day (QID) | ORAL | 0 refills | Status: AC

## 2024-06-28 NOTE — Telephone Encounter (Signed)
 1) Omeprazole  20 mg 2 times a day x 14 d 2) Pepto Bismol 2 tabs (262 mg each) 4 times a day x 14 d 3) Metronidazole  250 mg 4 times a day x 14 d 4) doxycycline  100 mg 2 times a day x 14 d  After 14 d stop omeprazole  also  In 4 weeks after treatment completed do H. Pylori stool antigen - dx H. Pylori gastritis  Tell patient to stop taking his iron tablets while he is taking these antibiotics as it can interfere with absorption of iron and the iron can interfere with the absorption of doxycycline  he should restart when he is finished   I sent in prescriptions please just call the patient and make sure he understands and set up the H. pylori stool antigen which can be done via [facility]

## 2024-06-29 NOTE — Telephone Encounter (Signed)
 Patient aware and agrees to this plan of care.

## 2024-07-03 ENCOUNTER — Other Ambulatory Visit: Payer: Self-pay | Admitting: Family Medicine

## 2024-07-13 ENCOUNTER — Telehealth: Payer: Self-pay | Admitting: Internal Medicine

## 2024-07-13 ENCOUNTER — Other Ambulatory Visit: Payer: Self-pay | Admitting: *Deleted

## 2024-07-13 DIAGNOSIS — B9681 Helicobacter pylori [H. pylori] as the cause of diseases classified elsewhere: Secondary | ICD-10-CM

## 2024-07-13 DIAGNOSIS — A048 Other specified bacterial intestinal infections: Secondary | ICD-10-CM

## 2024-07-13 NOTE — Telephone Encounter (Signed)
 Inbound call from patient stating he would like to speak to nurse and discuss next steps since he was advised to take antibiotics and now that he's finished them he would like to know what's next. Requesting a call back  Please advise  Thank you

## 2024-07-13 NOTE — Telephone Encounter (Signed)
 Spoke with patient . Advised to pick up [employer]  test kit on 3rd floor to check for H.pylori eradication. Patient understands how to obtain and return specimen to  lab.  Patient will test 1/7 which is 4 weeks post treatment.

## 2024-08-17 ENCOUNTER — Ambulatory Visit: Payer: Self-pay | Admitting: Family Medicine

## 2024-08-17 ENCOUNTER — Encounter: Payer: Self-pay | Admitting: Family Medicine

## 2024-08-17 ENCOUNTER — Ambulatory Visit: Admitting: Family Medicine

## 2024-08-17 VITALS — BP 120/80 | HR 85 | Temp 97.4°F | Ht 71.0 in | Wt 215.0 lb

## 2024-08-17 DIAGNOSIS — Z7985 Long-term (current) use of injectable non-insulin antidiabetic drugs: Secondary | ICD-10-CM

## 2024-08-17 DIAGNOSIS — I10 Essential (primary) hypertension: Secondary | ICD-10-CM | POA: Diagnosis not present

## 2024-08-17 DIAGNOSIS — E1165 Type 2 diabetes mellitus with hyperglycemia: Secondary | ICD-10-CM | POA: Diagnosis not present

## 2024-08-17 LAB — POCT GLYCOSYLATED HEMOGLOBIN (HGB A1C): Hemoglobin A1C: 5.8 % — AB (ref 4.0–5.6)

## 2024-08-17 LAB — MICROALBUMIN / CREATININE URINE RATIO
Creatinine,U: 34.1 mg/dL
Microalb Creat Ratio: UNDETERMINED mg/g (ref 0.0–30.0)
Microalb, Ur: <0.7 mg/dL

## 2024-08-17 MED ORDER — TIRZEPATIDE 7.5 MG/0.5ML ~~LOC~~ SOAJ: 7.5000 mg | SUBCUTANEOUS | 1 refills | Status: AC

## 2024-08-17 NOTE — Progress Notes (Signed)
" ° °  Thomas Stuart is a 74 y.o. male who presents today for an office visit.  Assessment/Plan:  Chronic Problems Addressed Today: Type 2 diabetes mellitus with hyperglycemia (HCC) A1c very well-controlled 5.8.  Doing well with Mounjaro  7.5 mg weekly.  Down about 15 pounds since last time he was here.  We did discuss adjusting medications however he would like to continue with current dose for now.  He will continue to work on diet and exercise.  Recheck in 6 months at CPE.  Hypertension Blood pressure at goal today on benazepril  HCTZ 10-12.5 once daily.     Subjective:  HPI:  See assessment / plan for status of chronic conditions.   Discussed the use of AI scribe software for clinical note transcription with the patient, who gave verbal consent to proceed.  History of Present Illness Thomas Stuart is a 74 year old male who presents for follow-up regarding weight management and diabetes control.  He has lost approximately 15 pounds since his last visit, currently weighing between 212-214 pounds. This weight loss is attributed to the use of Mounjaro , which he takes at a dose of 7.5 mg. The medication helps reduce his appetite, allowing him to eat only half of what he normally would. His goal is to reach a weight of 195-200 pounds. He did not take his dose on the morning of the visit as he usually administers it on Mondays.  His blood sugar levels have improved, with a recent reading of 5.8, down from a previous reading of 6.5. His blood pressure has also been within normal limits.  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 He acknowledges the importance of maintaining a healthy diet, including fruits and vegetables.  He recently underwent a colonoscopy that revealed an H. pylori infection, for which he completed a course of antibiotics. A small hernia was found during the procedure, but it was not considered  significant.  He has changed his insurance from [employer] to [employer], which affects the cost of his medications. He is concerned about the potential increase in cost for Mounjaro , which he currently obtains at a cost of approximately $141 for a three-month supply.         Objective:  Physical Exam: BP 120/80   Pulse 85   Temp (!) 97.4 F (36.3 C) (Temporal)   Ht 5' 11 (1.803 m)   Wt 215 lb (97.5 kg)   SpO2 95%   BMI 29.99 kg/m   Wt Readings from Last 3 Encounters:  08/17/24 215 lb (97.5 kg)  06/24/24 228 lb (103.4 kg)  05/20/24 228 lb (103.4 kg)    Gen: No acute distress, resting comfortably CV: Regular rate and rhythm with no murmurs appreciated Pulm: Normal work of breathing, clear to auscultation bilaterally with no crackles, wheezes, or rhonchi Neuro: Grossly normal, moves all extremities Psych: Normal affect and thought content      Thomas Stuart M. Kennyth, MD 08/17/2024 8:46 AM  "

## 2024-08-17 NOTE — Patient Instructions (Signed)
 It was very nice to see you today!  VISIT SUMMARY: During your visit, we discussed your progress with weight management and diabetes control. You have successfully lost 15 pounds and your blood sugar levels have improved. We also reviewed your recent colonoscopy results and addressed your concerns about medication costs.  YOUR PLAN: TYPE 2 DIABETES MELLITUS: Your diabetes is well-controlled with an HbA1c of 5.8%. -Continue taking Mounjaro  7.5 mg as it is effective in reducing appetite and aiding weight loss. -Monitor your weight and HbA1c levels. -We will discuss the possibility of weaning off Mounjaro  once you reach your weight goal of 195-200 pounds.  PRIMARY HYPERTENSION: Your blood pressure is well-controlled. -Continue monitoring your blood pressure regularly.  HELICOBACTER PYLORI GASTRITIS WITH BLEEDING: You were recently diagnosed and treated with antibiotics for H. pylori infection. -Await stool test results to confirm eradication of H. pylori.  GENERAL HEALTH MAINTENANCE: You maintain an active lifestyle and plan to resume bicycling. -Continue physical activity and a healthy diet. -Follow up in six months for a regular physical examination.  Return in about 6 months (around 02/14/2025) for Annual Physical.   Take care, Dr Kennyth  PLEASE NOTE:  If you had any lab tests, please let us  know if you have not heard back within a few days. You may see your results on mychart before we have a chance to review them but we will give you a call once they are reviewed by us .   If we ordered any referrals today, please let us  know if you have not heard from their office within the next week.   If you had any urgent prescriptions sent in today, please check with the pharmacy within an hour of our visit to make sure the prescription was transmitted appropriately.   Please try these tips to maintain a healthy lifestyle:  Eat at least 3 REAL meals and 1-2 snacks per day.  Aim for no more  than 5 hours between eating.  If you eat breakfast, please do so within one hour of getting up.   Each meal should contain half fruits/vegetables, one quarter protein, and one quarter carbs (no bigger than a computer mouse)  Cut down on sweet beverages. This includes juice, soda, and sweet tea.   Drink at least 1 glass of water  with each meal and aim for at least 8 glasses per day  Exercise at least 150 minutes every week.

## 2024-08-17 NOTE — Assessment & Plan Note (Signed)
 A1c very well-controlled 5.8.  Doing well with Mounjaro  7.5 mg weekly.  Down about 15 pounds since last time he was here.  We did discuss adjusting medications however he would like to continue with current dose for now.  He will continue to work on diet and exercise.  Recheck in 6 months at CPE.

## 2024-08-17 NOTE — Assessment & Plan Note (Signed)
 Blood pressure at goal today on benazepril  HCTZ 10-12.5 once daily.

## 2024-08-17 NOTE — Progress Notes (Signed)
 Urine sample normal. We can recheck in a year.

## 2024-08-24 ENCOUNTER — Telehealth: Payer: Self-pay | Admitting: Internal Medicine

## 2024-08-24 DIAGNOSIS — B9681 Helicobacter pylori [H. pylori] as the cause of diseases classified elsewhere: Secondary | ICD-10-CM

## 2024-08-24 NOTE — Telephone Encounter (Signed)
 I called him and informed and H. pylori has been eradicated.  He will follow-up as needed.

## 2024-09-04 ENCOUNTER — Encounter: Payer: Self-pay | Admitting: Internal Medicine

## 2024-09-09 ENCOUNTER — Telehealth: Payer: Self-pay | Admitting: *Deleted

## 2024-09-09 ENCOUNTER — Other Ambulatory Visit (HOSPITAL_COMMUNITY): Payer: Self-pay

## 2024-09-09 NOTE — Progress Notes (Signed)
 Thomas Stuart                                          MRN: [id number]   09/09/2024   The [facility] Quality Team Specialist reviewed this patient medical record for the purposes of chart review for care gap closure. The following were reviewed: chart review for care gap closure-kidney health evaluation for diabetes:eGFR  and uACR.    [facility] Quality Team

## 2024-09-09 NOTE — Telephone Encounter (Signed)
 Copied from CRM [id number]. Topic: General - Other >> Sep 09, 2024 10:23 AM Wess RAMAN wrote: Reason for CRM: Patient stated he has to pay $600 deductible for his diabetes medication. Patient needs documentation stating he is a diabetic sent to his insurance, [employer].   Callback #: [id number]   Please start PA Rx Mounjaro   Dx [phone] Thomas Stuart

## 2024-09-09 NOTE — Telephone Encounter (Signed)
 Patient aware  Rx Mounjaro  does not need a PA. For a 30 day supply it would be $213.32. He does also has a deductible.  Verbalized understanding  Advise to call insurance for alternatives

## 2025-02-15 ENCOUNTER — Encounter: Admitting: Family Medicine

## 2025-05-12 ENCOUNTER — Ambulatory Visit
